# Patient Record
Sex: Male | Born: 1935 | Race: White | Hispanic: No | Marital: Married | Smoking: Former smoker
Health system: Southern US, Community
[De-identification: ages and names within clinical notes are randomized; demographics above are authoritative.]

## PROBLEM LIST (undated history)

## (undated) DIAGNOSIS — M1990 Unspecified osteoarthritis, unspecified site: Secondary | ICD-10-CM

## (undated) DIAGNOSIS — D649 Anemia, unspecified: Secondary | ICD-10-CM

## (undated) DIAGNOSIS — Z87891 Personal history of nicotine dependence: Secondary | ICD-10-CM

## (undated) HISTORY — DX: Anemia, unspecified: D64.9

## (undated) HISTORY — DX: Unspecified osteoarthritis, unspecified site: M19.90

---

## 1977-12-31 DIAGNOSIS — Z87891 Personal history of nicotine dependence: Secondary | ICD-10-CM

## 1977-12-31 HISTORY — DX: Personal history of nicotine dependence: Z87.891

## 2009-04-30 DEATH — deceased

## 2016-07-23 ENCOUNTER — Inpatient Hospital Stay (HOSPITAL_COMMUNITY)
Admission: EM | Admit: 2016-07-23 | Discharge: 2016-07-25 | DRG: 872 | Disposition: A | Discharge status: Home / Self Care | Payer: Medicare Other | Attending: Internal Medicine | Admitting: Internal Medicine

## 2016-07-23 ENCOUNTER — Inpatient Hospital Stay (HOSPITAL_COMMUNITY): Payer: Medicare Other

## 2016-07-23 ENCOUNTER — Emergency Department (HOSPITAL_COMMUNITY): Payer: Medicare Other

## 2016-07-23 ENCOUNTER — Encounter (HOSPITAL_COMMUNITY): Payer: Self-pay | Admitting: *Deleted

## 2016-07-23 DIAGNOSIS — Z955 Presence of coronary angioplasty implant and graft: Secondary | ICD-10-CM | POA: Diagnosis not present

## 2016-07-23 DIAGNOSIS — Z87891 Personal history of nicotine dependence: Secondary | ICD-10-CM

## 2016-07-23 DIAGNOSIS — N183 Chronic kidney disease, stage 3 (moderate): Secondary | ICD-10-CM | POA: Diagnosis present

## 2016-07-23 DIAGNOSIS — N179 Acute kidney failure, unspecified: Secondary | ICD-10-CM | POA: Diagnosis present

## 2016-07-23 DIAGNOSIS — N189 Chronic kidney disease, unspecified: Secondary | ICD-10-CM

## 2016-07-23 DIAGNOSIS — K224 Dyskinesia of esophagus: Secondary | ICD-10-CM | POA: Diagnosis present

## 2016-07-23 DIAGNOSIS — C8590 Non-Hodgkin lymphoma, unspecified, unspecified site: Secondary | ICD-10-CM | POA: Diagnosis present

## 2016-07-23 DIAGNOSIS — R600 Localized edema: Secondary | ICD-10-CM | POA: Diagnosis not present

## 2016-07-23 DIAGNOSIS — Z951 Presence of aortocoronary bypass graft: Secondary | ICD-10-CM

## 2016-07-23 DIAGNOSIS — M79662 Pain in left lower leg: Secondary | ICD-10-CM | POA: Diagnosis present

## 2016-07-23 DIAGNOSIS — R911 Solitary pulmonary nodule: Secondary | ICD-10-CM | POA: Diagnosis present

## 2016-07-23 DIAGNOSIS — J181 Lobar pneumonia, unspecified organism: Secondary | ICD-10-CM

## 2016-07-23 DIAGNOSIS — E860 Dehydration: Secondary | ICD-10-CM | POA: Diagnosis present

## 2016-07-23 DIAGNOSIS — C61 Malignant neoplasm of prostate: Secondary | ICD-10-CM | POA: Diagnosis present

## 2016-07-23 DIAGNOSIS — I2510 Atherosclerotic heart disease of native coronary artery without angina pectoris: Secondary | ICD-10-CM | POA: Diagnosis present

## 2016-07-23 DIAGNOSIS — M069 Rheumatoid arthritis, unspecified: Secondary | ICD-10-CM | POA: Diagnosis present

## 2016-07-23 DIAGNOSIS — I350 Nonrheumatic aortic (valve) stenosis: Secondary | ICD-10-CM | POA: Diagnosis present

## 2016-07-23 DIAGNOSIS — M7989 Other specified soft tissue disorders: Secondary | ICD-10-CM | POA: Diagnosis present

## 2016-07-23 DIAGNOSIS — A419 Sepsis, unspecified organism: Secondary | ICD-10-CM | POA: Diagnosis not present

## 2016-07-23 DIAGNOSIS — R222 Localized swelling, mass and lump, trunk: Secondary | ICD-10-CM

## 2016-07-23 DIAGNOSIS — L03116 Cellulitis of left lower limb: Secondary | ICD-10-CM | POA: Diagnosis present

## 2016-07-23 DIAGNOSIS — D638 Anemia in other chronic diseases classified elsewhere: Secondary | ICD-10-CM | POA: Diagnosis present

## 2016-07-23 DIAGNOSIS — B9689 Other specified bacterial agents as the cause of diseases classified elsewhere: Secondary | ICD-10-CM | POA: Diagnosis not present

## 2016-07-23 DIAGNOSIS — I739 Peripheral vascular disease, unspecified: Secondary | ICD-10-CM | POA: Diagnosis present

## 2016-07-23 HISTORY — DX: Personal history of nicotine dependence: Z87.891

## 2016-07-23 LAB — URINE MICROSCOPIC-ADD ON

## 2016-07-23 LAB — CBC WITH DIFFERENTIAL/PLATELET
BASOS ABS: 0 10*3/uL (ref 0.0–0.1)
Basophils Relative: 0 %
EOS PCT: 0 %
Eosinophils Absolute: 0 10*3/uL (ref 0.0–0.7)
HEMATOCRIT: 32.7 % — AB (ref 39.0–52.0)
Hemoglobin: 10.1 g/dL — ABNORMAL LOW (ref 13.0–17.0)
LYMPHS ABS: 0.5 10*3/uL — AB (ref 0.7–4.0)
Lymphocytes Relative: 2 %
MCH: 28.1 pg (ref 26.0–34.0)
MCHC: 30.9 g/dL (ref 30.0–36.0)
MCV: 91.1 fL (ref 78.0–100.0)
MONO ABS: 0.6 10*3/uL (ref 0.1–1.0)
Monocytes Relative: 3 %
NEUTROS ABS: 21.8 10*3/uL — AB (ref 1.7–7.7)
Neutrophils Relative %: 95 %
Platelets: 212 10*3/uL (ref 150–400)
RBC: 3.59 MIL/uL — AB (ref 4.22–5.81)
RDW: 16.2 % — ABNORMAL HIGH (ref 11.5–15.5)
WBC: 22.9 10*3/uL — AB (ref 4.0–10.5)

## 2016-07-23 LAB — I-STAT CG4 LACTIC ACID, ED
LACTIC ACID, VENOUS: 1.11 mmol/L (ref 0.5–1.9)
Lactic Acid, Venous: 2.17 mmol/L (ref 0.5–1.9)
Lactic Acid, Venous: 1.71 mmol/L (ref 0.5–1.9)

## 2016-07-23 LAB — COMPREHENSIVE METABOLIC PANEL
ALBUMIN: 2.8 g/dL — AB (ref 3.5–5.0)
ALT: 17 U/L (ref 17–63)
AST: 41 U/L (ref 15–41)
Alkaline Phosphatase: 43 U/L (ref 38–126)
Anion gap: 8 (ref 5–15)
BILIRUBIN TOTAL: 0.9 mg/dL (ref 0.3–1.2)
BUN: 38 mg/dL — AB (ref 6–20)
CO2: 25 mmol/L (ref 22–32)
CREATININE: 2.26 mg/dL — AB (ref 0.61–1.24)
Calcium: 8.4 mg/dL — ABNORMAL LOW (ref 8.9–10.3)
Chloride: 106 mmol/L (ref 101–111)
GFR calc Af Amer: 30 mL/min — ABNORMAL LOW (ref >60)
GFR, EST NON AFRICAN AMERICAN: 26 mL/min — AB (ref >60)
GLUCOSE: 115 mg/dL — AB (ref 65–99)
POTASSIUM: 4 mmol/L (ref 3.5–5.1)
Sodium: 139 mmol/L (ref 135–145)
TOTAL PROTEIN: 6.5 g/dL (ref 6.5–8.1)

## 2016-07-23 LAB — URINALYSIS, ROUTINE W REFLEX MICROSCOPIC
GLUCOSE, UA: NEGATIVE mg/dL
HGB URINE DIPSTICK: NEGATIVE
Ketones, ur: NEGATIVE mg/dL
Nitrite: NEGATIVE
PH: 5 (ref 5.0–8.0)
Protein, ur: NEGATIVE mg/dL
Specific Gravity, Urine: 1.021 (ref 1.005–1.030)

## 2016-07-23 LAB — I-STAT BETA HCG BLOOD, ED (MC, WL, AP ONLY): HCG, QUANTITATIVE: 5.5 m[IU]/mL — AB (ref <5)

## 2016-07-23 MED ORDER — PIPERACILLIN-TAZOBACTAM 3.375 G IVPB
3.3750 g | Freq: Three times a day (TID) | INTRAVENOUS | Status: DC
  Administered 2016-07-23 – 2016-07-24 (×2): 3.375 g via INTRAVENOUS
  Filled 2016-07-23 (×4): qty 50

## 2016-07-23 MED ORDER — HYDROXYCHLOROQUINE SULFATE 200 MG PO TABS
200.0000 mg | ORAL_TABLET | Freq: Every day | ORAL | Status: DC
  Administered 2016-07-24 – 2016-07-25 (×2): 200 mg via ORAL
  Filled 2016-07-23 (×2): qty 1

## 2016-07-23 MED ORDER — METOPROLOL SUCCINATE ER 50 MG PO TB24
50.0000 mg | ORAL_TABLET | Freq: Every day | ORAL | Status: DC
Start: 2016-07-24 — End: 2016-07-23

## 2016-07-23 MED ORDER — ASPIRIN EC 81 MG PO TBEC
81.0000 mg | DELAYED_RELEASE_TABLET | Freq: Every day | ORAL | Status: DC
  Administered 2016-07-24 – 2016-07-25 (×2): 81 mg via ORAL
  Filled 2016-07-23 (×2): qty 1

## 2016-07-23 MED ORDER — SODIUM CHLORIDE 0.9 % IV SOLN
INTRAVENOUS | Status: DC
  Administered 2016-07-23: 17:00:00 via INTRAVENOUS

## 2016-07-23 MED ORDER — ACETAMINOPHEN 650 MG RE SUPP: 650.0000 mg | Freq: Four times a day (QID) | RECTAL | Status: DC | PRN

## 2016-07-23 MED ORDER — GABAPENTIN 100 MG PO CAPS
100.0000 mg | ORAL_CAPSULE | Freq: Every day | ORAL | Status: DC
  Administered 2016-07-23 – 2016-07-24 (×2): 100 mg via ORAL
  Filled 2016-07-23 (×2): qty 1

## 2016-07-23 MED ORDER — PREDNISONE 5 MG PO TABS
6.0000 mg | ORAL_TABLET | Freq: Every day | ORAL | Status: DC
  Administered 2016-07-24 – 2016-07-25 (×2): 6 mg via ORAL
  Filled 2016-07-23 (×3): qty 1

## 2016-07-23 MED ORDER — FERROUS SULFATE DRIED ER 45 MG PO TBCR: 90.0000 mg | EXTENDED_RELEASE_TABLET | Freq: Every day | ORAL | Status: DC

## 2016-07-23 MED ORDER — TRAMADOL HCL 50 MG PO TABS: 50.0000 mg | ORAL_TABLET | Freq: Two times a day (BID) | ORAL | Status: DC | PRN

## 2016-07-23 MED ORDER — VANCOMYCIN HCL 500 MG IV SOLR
500.0000 mg | INTRAVENOUS | Status: DC
  Administered 2016-07-24: 500 mg via INTRAVENOUS
  Filled 2016-07-23 (×2): qty 500

## 2016-07-23 MED ORDER — VANCOMYCIN HCL IN DEXTROSE 1-5 GM/200ML-% IV SOLN
1000.0000 mg | Freq: Once | INTRAVENOUS | Status: AC
  Administered 2016-07-23: 1000 mg via INTRAVENOUS
  Filled 2016-07-23: qty 200

## 2016-07-23 MED ORDER — SODIUM CHLORIDE 0.9 % IV BOLUS (SEPSIS)
1000.0000 mL | Freq: Once | INTRAVENOUS | Status: AC
  Administered 2016-07-23: 1000 mL via INTRAVENOUS

## 2016-07-23 MED ORDER — SODIUM CHLORIDE 0.9 % IV BOLUS (SEPSIS)
1000.0000 mL | Freq: Once | INTRAVENOUS | Status: AC
Start: 2016-07-23 — End: 2016-07-23
  Administered 2016-07-23: 1000 mL via INTRAVENOUS

## 2016-07-23 MED ORDER — FERROUS SULFATE 325 (65 FE) MG PO TABS
325.0000 mg | ORAL_TABLET | Freq: Every day | ORAL | Status: DC
  Administered 2016-07-24 – 2016-07-25 (×2): 325 mg via ORAL
  Filled 2016-07-23 (×2): qty 1

## 2016-07-23 MED ORDER — PANTOPRAZOLE SODIUM 40 MG PO TBEC
40.0000 mg | DELAYED_RELEASE_TABLET | Freq: Every day | ORAL | Status: DC
  Administered 2016-07-24 – 2016-07-25 (×2): 40 mg via ORAL
  Filled 2016-07-23 (×2): qty 1

## 2016-07-23 MED ORDER — SODIUM CHLORIDE 0.9% FLUSH
3.0000 mL | Freq: Two times a day (BID) | INTRAVENOUS | Status: DC
  Administered 2016-07-23 – 2016-07-24 (×2): 3 mL via INTRAVENOUS

## 2016-07-23 MED ORDER — PIPERACILLIN-TAZOBACTAM 3.375 G IVPB 30 MIN
3.3750 g | Freq: Once | INTRAVENOUS | Status: AC
  Administered 2016-07-23: 3.375 g via INTRAVENOUS
  Filled 2016-07-23: qty 50

## 2016-07-23 MED ORDER — SODIUM CHLORIDE 0.9 % IV SOLN
INTRAVENOUS | Status: AC
  Administered 2016-07-23: 19:00:00 via INTRAVENOUS

## 2016-07-23 MED ORDER — SENNOSIDES-DOCUSATE SODIUM 8.6-50 MG PO TABS: 1.0000 | ORAL_TABLET | Freq: Every evening | ORAL | Status: DC | PRN

## 2016-07-23 MED ORDER — ENOXAPARIN SODIUM 30 MG/0.3ML ~~LOC~~ SOLN
30.0000 mg | SUBCUTANEOUS | Status: DC
  Administered 2016-07-23 – 2016-07-24 (×2): 30 mg via SUBCUTANEOUS
  Filled 2016-07-23: qty 0.3

## 2016-07-23 MED ORDER — ACETAMINOPHEN 325 MG PO TABS
650.0000 mg | ORAL_TABLET | Freq: Four times a day (QID) | ORAL | Status: DC | PRN
  Administered 2016-07-23 – 2016-07-24 (×2): 650 mg via ORAL
  Filled 2016-07-23 (×2): qty 2

## 2016-07-23 NOTE — H&P (Signed)
Date: 07/23/2016               Patient Name:  Caleb Christian MRN: [id number]  DOB: Mar 07, 1934 Age / Sex: 82 y.o., male   PCP: No primary care provider on file.         Medical Service: Internal Medicine Teaching Service         Attending Physician: Dr. Aldine Contes, MD    First Contact: Dr. Asencion Partridge Pager: [phone]  Second Contact: Dr. Jacques Earthly Pager: [phone]       After Hours (After 5p/  First Contact Pager: [phone]  weekends / holidays): Second Contact Pager: [phone]   Chief Complaint: Left leg swelling and erythema  History of Present Illness: Caleb Christian is a 82 year old gentleman with PMH rheumatoid arthritis, CKD3, anemia of chronic disease, CAD s/p 2 stents and CABG, MI, aortic stenosis, prostate cancer, lymphoma, and PAD s/p L fem-pop bypass 01/2016 who presents with 2 days of worsening left leg swelling and erythema, along with episodes of confusion and nausea/vomiting. Caleb Christian lives in [place], [place] and gets all his medical care up there and is here in [place] visiting his daughter with his wife. This episode is very similar to a hospitalization in May 2017 for left leg cellulitis that required 9 days of IV antibiotics. At that time he was discharged on 7 days of Amoxacillin and the swelling and pain in his left leg improved, but the erythema (from foot to mid-calf) has persisted and this was felt to be benign by his care team.  Per the patient in family at bedside, two days ago he noticed that his left leg was more swollen than usual, and that the erythema was beginning to spread up his leg. This gradually worsened and now he his L calf and foot are brightly erythematous and moderately swollen. The erythema tracks up the medial and lateral sides of his thigh as well. Additionally he developed fevers and chills last night while watching TV with his family, and after dinner he vomited and was talking strangely and seemed confused with his family. He woke up in the  middle of the night with no memory of this episode. He has been today but the nausea/vomiting and chills persisted. He denies pain in the affected extremity, chest pain, shortness of breath, appetite loss, or abdominal pain.  In the ED was hypotensive to 97/56, HR 56, RR 24, 96% on RA. Labs remarkable for WBC 22.9, Cr 2.26, lactic acid 2.17. Blood cx drawn, given 2x 1L NS bolus, started on Vanc/Zosyn.  Meds: Current Facility-Administered Medications  Medication Dose Route Frequency Provider Last Rate Last Dose  . 0.9 %  sodium chloride infusion   Intravenous Continuous Milagros Loll, MD 100 mL/hr at 07/23/16 1845    . acetaminophen (TYLENOL) tablet 650 mg  650 mg Oral Q6H PRN Milagros Loll, MD       Or  . acetaminophen (TYLENOL) suppository 650 mg  650 mg Rectal Q6H PRN Milagros Loll, MD      . Derrill Memo ON 07/24/2016] aspirin EC tablet 81 mg  81 mg Oral Daily Milagros Loll, MD      . enoxaparin (LOVENOX) injection 30 mg  30 mg Subcutaneous Q24H Milagros Loll, MD      . Derrill Memo ON 07/24/2016] Ferrous Sulfate Dried TBCR 90 mg  90 mg Oral Daily Milagros Loll, MD      . gabapentin (NEURONTIN) capsule 100  mg  100 mg Oral QHS Milagros Loll, MD      . Derrill Memo ON 07/24/2016] hydroxychloroquine (PLAQUENIL) tablet 200 mg  200 mg Oral Daily Milagros Loll, MD      . Derrill Memo ON 07/24/2016] pantoprazole (PROTONIX) EC tablet 40 mg  40 mg Oral Daily Milagros Loll, MD      . piperacillin-tazobactam (ZOSYN) IVPB 3.375 g  3.375 g Intravenous Q8H Rachel L Rumbarger, RPH      . [START ON 07/24/2016] predniSONE (DELTASONE) tablet 6 mg  6 mg Oral Q breakfast Milagros Loll, MD      . senna-docusate (Senokot-S) tablet 1 tablet  1 tablet Oral QHS PRN Milagros Loll, MD      . sodium chloride flush (NS) 0.9 % injection 3 mL  3 mL Intravenous Q12H Milagros Loll, MD      . traMADol Veatrice Bourbon) tablet 50 mg  50 mg Oral Q12H PRN Milagros Loll, MD      . Derrill Memo ON 07/24/2016] vancomycin (VANCOCIN) 500  mg in sodium chloride 0.9 % 100 mL IVPB  500 mg Intravenous Q24H Valeda Malm Rumbarger, RPH        Allergies: Allergies as of 07/23/2016 - Review Complete 07/23/2016  Allergen Reaction Noted  . Adhesive [tape] Other (See Comments) 07/23/2016  . Codeine Hives 07/23/2016   No past medical history on file.  Family History: Family history significant for rheumatoid arthritis, heart disease, Alzheimer's disease  Social History:  Married, lives at home with wife in [place], former smoker 1ppd x32 yrs quit 1979, no alcohol, no recreational drugs, ambulates independently and independent in ADLs and IADLs at home  Code status - desires full code after discussion  Review of Systems: A complete ROS was negative except as per HPI.   Physical Exam: Blood pressure (!) 112/55, pulse (!) 106, temperature 98 F (36.7 C), temperature source Oral, resp. rate 16, height 5\' 7"  (1.702 m), weight 62.1 kg (137 lb), SpO2 90 %.   General appearance: Thin elderly gentleman resting comfortably in stretcher, conversational and appears stated age HENT: Normocephalic, atraumatic, neck supple Eyes: PERRL, non-icteric Cardiovascular: Systolic crescendo-decrescendo murmur over LSB, regular rate and rhythm Respiratory: Clear to auscultation bilaterally, normal work of breathing Abdomen: BS+, soft, non-tender, non-distended Extremities/Back: Soft mobile mass at posterior border of left axilla, 1+ pulses in LLE, otherwise 2+ throughout Skin: Warm, dry, diffuse chronic bruising over bilateral forearms, erythema and swelling of the LLE, tracks up medial and lateral thigh Neuro: Cranial nerves grossly intact, alert and oriented Psych: Appropriate affect  CBC Latest Ref Rng & Units 07/23/2016  WBC 4.0 - 10.5 K/uL 22.9(H)  Hemoglobin 13.0 - 17.0 g/dL 10.1(L)  Hematocrit 39.0 - 52.0 % 32.7(L)  Platelets 150 - 400 K/uL 212   BMP Latest Ref Rng & Units 07/23/2016  Glucose 65 - 99 mg/dL 115(H)  BUN 6 - 20 mg/dL 38(H)    Creatinine 0.61 - 1.24 mg/dL 2.26(H)  Sodium 135 - 145 mmol/L 139  Potassium 3.5 - 5.1 mmol/L 4.0  Chloride 101 - 111 mmol/L 106  CO2 22 - 32 mmol/L 25  Calcium 8.9 - 10.3 mg/dL 8.4(L)   EKG: Rate 60, QT 461, SR RBBB, LVH, LAFB  CXR: IMPRESSION: Focal masslike area of consolidation within the right lower lung. This is nonspecific in etiology. While this may potentially be infectious in etiology, malignancy or other processes are not excluded. Recommend dedicated evaluation with chest CT. Aortic vascular calcifications.  Assessment & Plan  by Problem: Principal Problem:   Pain and swelling of left lower leg Active Problems:   Sepsis ([place])   Lung nodule   Acute-on-chronic kidney injury [facility]) Caleb Christian is a 82 year old gentleman with extensive PMH detailed above here for sepsis likely secondary for recurrence of LLE cellulitis.  Sepsis, presented hypotensive and tachypneic, hemodynamics improved s/p 2L IVF, likely dehydration, due to LLE cellulitis vs thrombophlebitis, very similar past hospitalization for LLE cellulitis in May   - Continue empiric Vanc/Zosyn  - mIVF, bolus as needed  - LLE venous duplex ultrasound  - Follow blood cultures  - Hold home metoprolol given relative ongoing hypotension  Acute on chronic kidney disease, likely pre-renal due to dehydration and N/V, CrCl is 23 and use of fenofebrate is contraindicated  - mIVF  - Hold fenofibrate  - Avoid NSAIDs, contrast, other nephrotoxic agents, renally-dose when possible  Lung nodule, not present on previous outside imaging  - CT chest without contrast  CAD Anemia of chronic disease Rheumatoid arthritis  Continue home medications   Dispo: Admit patient to Inpatient with expected length of stay greater than 2 midnights.  Signed: Asencion Partridge, MD 07/23/2016, 7:49 PM  Pager: [phone]

## 2016-07-23 NOTE — ED Notes (Signed)
Doctor notified of patient's test result lactic acid 2.17 and WBC 22.9.

## 2016-07-23 NOTE — ED Provider Notes (Signed)
[facility] DEPT Provider Note   CSN: [id number] Arrival date & time: 07/23/16  1138  First Provider Contact:  First MD Initiated Contact with Patient 07/23/16 1425        History   Chief Complaint Chief Complaint  Patient presents with  . Cellulitis  . Altered Mental Status    HPI Caleb Christian is a 82 y.o. male.  Patient is a 82 year old male with a history of aortic stenosis, peripheral vascular disease status post leg bypass that was done in January presenting today with fever, left lower extremity swelling, redness and confusion. Symptoms started 2 days ago and have worsened. He denies any pain in the leg but has had multiple episodes of vomiting today. Patient is visiting family resides in [place] where he has had all of his medical care done. Similar symptoms occurred in May where he required a 9 day hospital stay for cellulitis. He has not been on antibiotics since then. He currently denies any chest pain, shortness of breath, nausea or abdominal pain. He has had no issues with urination.   The history is provided by the patient and the spouse.    No past medical history on file.  There are no active problems to display for this patient.   No past surgical history on file.     Home Medications    Prior to Admission medications   Medication Sig Start Date End Date Taking? Authorizing Provider  acetaminophen (TYLENOL) 650 MG CR tablet Take 1,300 mg by mouth 2 (two) times daily.   Yes Historical Provider, MD  aspirin EC 81 MG tablet Take 81 mg by mouth daily.   Yes Historical Provider, MD  fenofibrate (TRICOR) 145 MG tablet Take 145 mg by mouth daily.   Yes Historical Provider, MD  Ferrous Sulfate Dried (SLOW RELEASE IRON) 45 MG TBCR Take 90 mg by mouth daily.   Yes Historical Provider, MD  gabapentin (NEURONTIN) 100 MG capsule Take 100 mg by mouth at bedtime.  07/14/16  Yes Historical Provider, MD  hydroxychloroquine (PLAQUENIL) 200 MG tablet Take 200  mg by mouth daily. 06/15/16  Yes Historical Provider, MD  metoprolol succinate (TOPROL-XL) 50 MG 24 hr tablet Take 50 mg by mouth daily. 07/17/16  Yes Historical Provider, MD  omeprazole (PRILOSEC) 20 MG capsule Take 20 mg by mouth daily. 07/12/16  Yes Historical Provider, MD  predniSONE (DELTASONE) 1 MG tablet Take 6 mg by mouth daily with breakfast.   Yes Historical Provider, MD  traMADol (ULTRAM) 50 MG tablet Take 50 mg by mouth daily.   Yes Historical Provider, MD    Family History No family history on file.  Social History Social History  Substance Use Topics  . Smoking status: Not on file  . Smokeless tobacco: Not on file  . Alcohol use Not on file     Allergies   Adhesive [tape] and Codeine   Review of Systems Review of Systems  All other systems reviewed and are negative.    Physical Exam Updated Vital Signs BP 110/64 (BP Location: Right Arm)   Pulse (!) 52   Temp 97.9 F (36.6 C) (Oral)   Resp 12   Ht 5\' 7"  (1.702 m)   Wt 132 lb (59.9 kg)   SpO2 100%   BMI 20.67 kg/m   Physical Exam  Constitutional: He is oriented to person, place, and time. He appears well-developed and well-nourished. No distress.  HENT:  Head: Normocephalic and atraumatic.  Mouth/Throat: Oropharynx is clear  and moist.  Eyes: Conjunctivae and EOM are normal. Pupils are equal, round, and reactive to light.  Neck: Normal range of motion. Neck supple.  Cardiovascular: Normal rate, regular rhythm and intact distal pulses.   Murmur heard. Pulmonary/Chest: Effort normal and breath sounds normal. No respiratory distress. He has no wheezes. He has no rales.  Abdominal: Soft. He exhibits no distension. There is no tenderness. There is no rebound and no guarding.  Musculoskeletal: Normal range of motion. He exhibits no edema or tenderness.  Neurological: He is alert and oriented to person, place, and time.  Skin: Skin is warm and dry. Capillary refill takes 2 to 3 seconds. No rash noted. There is  erythema.     Psychiatric: He has a normal mood and affect. His behavior is normal.  Nursing note and vitals reviewed.    ED Treatments / Results  Labs (all labs ordered are listed, but only abnormal results are displayed) Labs Reviewed  COMPREHENSIVE METABOLIC PANEL - Abnormal; Notable for the following:       Result Value   Glucose, Bld 115 (*)    BUN 38 (*)    Creatinine, Ser 2.26 (*)    Calcium 8.4 (*)    Albumin 2.8 (*)    GFR calc non Af Amer 26 (*)    GFR calc Af Amer 30 (*)    All other components within normal limits  CBC WITH DIFFERENTIAL/PLATELET - Abnormal; Notable for the following:    WBC 22.9 (*)    RBC 3.59 (*)    Hemoglobin 10.1 (*)    HCT 32.7 (*)    RDW 16.2 (*)    Neutro Abs 21.8 (*)    Lymphs Abs 0.5 (*)    All other components within normal limits  I-STAT BETA HCG BLOOD, ED ([facility], [facility], [facility] ONLY) - Abnormal; Notable for the following:    I-stat hCG, quantitative 5.5 (*)    All other components within normal limits  I-STAT CG4 LACTIC ACID, ED - Abnormal; Notable for the following:    Lactic Acid, Venous 2.17 (*)    All other components within normal limits  CULTURE, BLOOD (ROUTINE X 2)  CULTURE, BLOOD (ROUTINE X 2)  URINE CULTURE  URINALYSIS, ROUTINE W REFLEX MICROSCOPIC (NOT AT [facility])  I-STAT CG4 LACTIC ACID, ED  I-STAT CG4 LACTIC ACID, ED    EKG  EKG Interpretation  Date/Time:  Monday July 23 2016 13:37:13 EDT Ventricular Rate:  60 PR Interval:    QRS Duration: 136 QT Interval:  461 QTC Calculation: 461 R Axis:   -44 Text Interpretation:  Sinus rhythm RBBB and LAFB Left ventricular hypertrophy No previous tracing Confirmed by Maryan Rued  MD, Loree Fee ([id number]) on 07/23/2016 2:10:31 PM       Radiology Dg Chest 2 View  Result Date: 07/23/2016 CLINICAL DATA:  Patient with lower the redness and swelling. Prior CABG procedure. EXAM: CHEST  2 VIEW COMPARISON:  None. FINDINGS: Normal cardiac and mediastinal contours. Focal masslike area of  consolidation within the right lower lung. No consolidative pulmonary opacities. No pleural effusion or pneumothorax. Left shoulder arthroplasty. Thoracic spine degenerative changes. Aortic vascular calcifications. IMPRESSION: Focal masslike area of consolidation within the right lower lung. This is nonspecific in etiology. While this may potentially be infectious in etiology, malignancy or other processes are not excluded. Recommend dedicated evaluation with chest CT. Aortic vascular calcifications. Electronically Signed   By: Lovey Newcomer M.D.   On: 07/23/2016 13:21   Procedures Procedures (including critical care  time)  Medications Ordered in ED Medications  vancomycin (VANCOCIN) 500 mg in sodium chloride 0.9 % 100 mL IVPB (not administered)  piperacillin-tazobactam (ZOSYN) IVPB 3.375 g (not administered)  sodium chloride 0.9 % bolus 1,000 mL (1,000 mLs Intravenous New Bag/Given 07/23/16 1340)    And  sodium chloride 0.9 % bolus 1,000 mL (1,000 mLs Intravenous New Bag/Given 07/23/16 1359)  piperacillin-tazobactam (ZOSYN) IVPB 3.375 g (0 g Intravenous Stopped 07/23/16 1430)  vancomycin (VANCOCIN) IVPB 1000 mg/200 mL premix (0 mg Intravenous Stopped 07/23/16 1500)     Initial Impression / Assessment and Plan / ED Course  I have reviewed the triage vital signs and the nursing notes.  Pertinent labs & imaging results that were available during my care of the patient were reviewed by me and considered in my medical decision making (see chart for details).  Clinical Course   Patient is a 82 year old male with multiple medical problems including recent bypass graft done in the left lower extremity in January presenting today with fever, confusion, vomiting and worsening left leg redness and warmth. Patient is visiting from [place] and states symptoms started a few days ago. This happened approximately 2 months ago and he required a 9 day stay in the hospital with IV antibiotics. He denies any  shortness of breath, chest pain or abdominal pain.  Patient meets sepsis criteria and labs are sent in the waiting room. His initial lactic acid was 2.17. Patient's creatinine is 2.5 with no known history of chronic renal insufficiency however all patients medical care is in [place] and he does not know his baseline creatinine.  Patient given 2 L of IV fluid based on sepsis criteria. He initially was mildly hypotensive which improved with fluids. Patient was started on Vanco and Zosyn for cellulitis protocol. White count is elevated at 22,000. Patient currently is not altered. He denies any cough but chest x-ray shows concern for mass that will need a CT. Patient will be admitted that CT was ordered.  Final Clinical Impressions(s) / ED Diagnoses   Final diagnoses:  None    New Prescriptions New Prescriptions   No medications on file     Blanchie Dessert, MD 07/23/16 1524

## 2016-07-23 NOTE — ED Notes (Signed)
Called mini lab states lactic acid currently running.

## 2016-07-23 NOTE — Progress Notes (Addendum)
Pharmacy Antibiotic Note  Caleb Christian is a 82 y.o. male admitted on 07/23/2016 with cellulitis.  Pharmacy has been consulted for vancomycin dosing. Pt is afebrile and WBC is elevated. SCr is elevated and so is lactic acid at 2.17.   Plan: - Vancomycin 1gm IV x 1 then 500mg  IV Q24H - Zosyn 3.375gm IV Q8H (4 hr inf) - F/u renal fxn, C&S, clinical status and trough at SS  Height: 5\' 7"  (170.2 cm) Weight: 132 lb (59.9 kg) IBW/kg (Calculated) : 66.1  Temp (24hrs), Avg:99.1 F (37.3 C), Min:98.8 F (37.1 C), Max:99.3 F (37.4 C)   Recent Labs Lab 07/23/16 1240 07/23/16 1313  WBC 22.9*  --   CREATININE 2.26*  --   LATICACIDVEN  --  2.17*    Estimated Creatinine Clearance: 22.5 mL/min (by C-G formula based on SCr of 2.26 mg/dL).    Allergies not on file  Antimicrobials this admission: Vanc 7/24>> Zosyn 7/24>>  Dose adjustments this admission: N/A  Microbiology results: Pending  Thank you for allowing pharmacy to be a part of this patient's care.  Shacoya Burkhammer, Rande Lawman 07/23/2016 1:45 PM

## 2016-07-23 NOTE — ED Triage Notes (Signed)
Pt here visiting from [place], [place]- left leg red with streak going up to thigh-- has had fem-pop bypass surgery on leg Jan 18th, 2017. Has had cellulitis in leg in the past. Leg is warm, red.  Pt had an episode of vomiting last night, then became confused -- does not remember what happened after vomiting. Also vomited this am.

## 2016-07-23 NOTE — Progress Notes (Signed)
Given the report given to me at report: patient choking on food and drink, and she is uncomfortable feeding him or giving him liquids, I paged On Call and informed her of this. Perhaps another SLP eval should be done.

## 2016-07-23 NOTE — ED Notes (Signed)
Pedal pulse audible with doppler-- marked with an X.

## 2016-07-23 NOTE — ED Notes (Signed)
Care transferred to [facility] [place], [place]

## 2016-07-23 NOTE — ED Notes (Signed)
Admitting at bedside 

## 2016-07-23 NOTE — Progress Notes (Signed)
Caleb Christian is a 82 y.o. male patient admitted from ED awake, alert - oriented  X 4 - no acute distress noted.  VSS - Blood pressure (!) 112/55, pulse (!) 106, temperature 98 F (36.7 C), temperature source Oral, resp. rate 16, height 5\' 7"  (1.702 m), weight 62.1 kg (137 lb), SpO2 90 %.    IV in place, occlusive dsg intact without redness.  Orientation to room, and floor completed with information packet given to patient/family.  Patient declined safety video at this time.  Admission INP armband ID verified with patient/family, and in place.   SR up x 2, fall assessment complete, with patient and family able to verbalize understanding of risk associated with falls, and verbalized understanding to call nsg before up out of bed.  Call light within reach, patient able to voice, and demonstrate understanding.  Skin, clean-dry- intact without evidence of bruising, or skin tears.   No evidence of skin break down noted on exam.     Will cont to eval and treat per MD orders.  Lynann Beaver, RN 07/23/2016 7:00 PM

## 2016-07-24 ENCOUNTER — Inpatient Hospital Stay (HOSPITAL_COMMUNITY): Payer: Medicare Other

## 2016-07-24 DIAGNOSIS — N183 Chronic kidney disease, stage 3 (moderate): Secondary | ICD-10-CM

## 2016-07-24 DIAGNOSIS — Z8719 Personal history of other diseases of the digestive system: Secondary | ICD-10-CM

## 2016-07-24 DIAGNOSIS — B9689 Other specified bacterial agents as the cause of diseases classified elsewhere: Secondary | ICD-10-CM

## 2016-07-24 DIAGNOSIS — M069 Rheumatoid arthritis, unspecified: Secondary | ICD-10-CM

## 2016-07-24 DIAGNOSIS — I2510 Atherosclerotic heart disease of native coronary artery without angina pectoris: Secondary | ICD-10-CM

## 2016-07-24 DIAGNOSIS — R600 Localized edema: Secondary | ICD-10-CM

## 2016-07-24 DIAGNOSIS — Z955 Presence of coronary angioplasty implant and graft: Secondary | ICD-10-CM

## 2016-07-24 DIAGNOSIS — R911 Solitary pulmonary nodule: Secondary | ICD-10-CM

## 2016-07-24 DIAGNOSIS — L03116 Cellulitis of left lower limb: Secondary | ICD-10-CM

## 2016-07-24 DIAGNOSIS — N179 Acute kidney failure, unspecified: Secondary | ICD-10-CM

## 2016-07-24 DIAGNOSIS — D631 Anemia in chronic kidney disease: Secondary | ICD-10-CM

## 2016-07-24 LAB — BASIC METABOLIC PANEL
ANION GAP: 7 (ref 5–15)
BUN: 33 mg/dL — AB (ref 6–20)
CALCIUM: 7.7 mg/dL — AB (ref 8.9–10.3)
CO2: 22 mmol/L (ref 22–32)
Chloride: 110 mmol/L (ref 101–111)
Creatinine, Ser: 1.78 mg/dL — ABNORMAL HIGH (ref 0.61–1.24)
GFR calc Af Amer: 40 mL/min — ABNORMAL LOW (ref >60)
GFR, EST NON AFRICAN AMERICAN: 35 mL/min — AB (ref >60)
Glucose, Bld: 77 mg/dL (ref 65–99)
POTASSIUM: 3.7 mmol/L (ref 3.5–5.1)
SODIUM: 139 mmol/L (ref 135–145)

## 2016-07-24 LAB — URINE CULTURE

## 2016-07-24 LAB — CBC
HCT: 27.1 % — ABNORMAL LOW (ref 39.0–52.0)
Hemoglobin: 8.3 g/dL — ABNORMAL LOW (ref 13.0–17.0)
MCH: 28.2 pg (ref 26.0–34.0)
MCHC: 30.6 g/dL (ref 30.0–36.0)
MCV: 92.2 fL (ref 78.0–100.0)
PLATELETS: 165 10*3/uL (ref 150–400)
RBC: 2.94 MIL/uL — AB (ref 4.22–5.81)
RDW: 16.5 % — AB (ref 11.5–15.5)
WBC: 15.3 10*3/uL — AB (ref 4.0–10.5)

## 2016-07-24 MED ORDER — VANCOMYCIN HCL 1000 MG IV SOLR
INTRAVENOUS | Status: AC
Start: 2016-07-24 — End: 2016-07-24
  Administered 2016-07-24: 14:00:00
  Filled 2016-07-24: qty 1000

## 2016-07-24 MED ORDER — DEXTROSE 5 % IV SOLN
1.0000 g | INTRAVENOUS | Status: AC
  Administered 2016-07-24 – 2016-07-25 (×2): 1 g via INTRAVENOUS
  Filled 2016-07-24 (×2): qty 10

## 2016-07-24 NOTE — Evaluation (Signed)
Clinical/Bedside Swallow Evaluation Patient Details  Name: Caleb Christian MRN: [id number] Date of Birth: 11/05/1934  Today's Date: 07/24/2016 Time: SLP Start Time (ACUTE ONLY): 1140 SLP Stop Time (ACUTE ONLY): 1154 SLP Time Calculation (min) (ACUTE ONLY): 14 min  Past Medical History:  Past Medical History:  Diagnosis Date  . Quit smoking 1979   Past Surgical History: No past surgical history on file. HPI:  Caleb Christian is a 82 year old gentleman with PMH rheumatoid arthritis, CKD3, anemia of chronic disease, CAD s/p 2 stents and CABG, MI, aortic stenosis, prostate cancer, lymphoma, and PAD s/p L fem-pop bypass 01/2016 who presents with 2 days of worsening left leg swelling and erythema, along with episodes of confusion and nausea/vomiting. Per RN note, pt choking with liquids. CT chest shows granulomatous infection and mild diffuse bronchial wall thickening.    Assessment / Plan / Recommendation Clinical Impression  Pt demonstrates adequate tolerance of solids and liquids with no signs of aspiration. He does report a history of esophageal dysphagia with stricture and a description of probable dysmotility following esophageal assessment in [place]. He verbalizes safety precautions and is able to choose appropriate textures of foods. SLP encouraged additional precautions in hospital setting particularly due to decreased mobility and upright posture. Pt and RN verbalized understanding. No SLP f/u needed, pt may continue diet.     Aspiration Risk  Mild aspiration risk    Diet Recommendation Regular;Thin liquid   Liquid Administration via: Cup;Straw Medication Administration: Whole meds with puree Supervision: Patient able to self feed Compensations: Small sips/bites;Slow rate;Follow solids with liquid Postural Changes: Seated upright at 90 degrees;Remain upright for at least 30 minutes after po intake    Other  Recommendations Oral Care Recommendations: Oral care BID   Follow up  Recommendations  None    Frequency and Duration            Prognosis        Swallow Study   General HPI: Caleb Christian is a 82 year old gentleman with PMH rheumatoid arthritis, CKD3, anemia of chronic disease, CAD s/p 2 stents and CABG, MI, aortic stenosis, prostate cancer, lymphoma, and PAD s/p L fem-pop bypass 01/2016 who presents with 2 days of worsening left leg swelling and erythema, along with episodes of confusion and nausea/vomiting. Per RN note, pt choking with liquids. CT chest shows granulomatous infection and mild diffuse bronchial wall thickening.  Type of Study: Bedside Swallow Evaluation Previous Swallow Assessment: pt has had MBS and barium swallow in [place], has had esophagus dilated 6 times.  Diet Prior to this Study: Regular;Thin liquids Temperature Spikes Noted: No Respiratory Status: Room air History of Recent Intubation: No Behavior/Cognition: Alert;Cooperative;Pleasant mood Oral Cavity Assessment: Within Functional Limits Oral Care Completed by SLP: No Oral Cavity - Dentition: Dentures, top;Dentures, bottom Vision: Functional for self-feeding Self-Feeding Abilities: Able to feed self Patient Positioning: Upright in bed Baseline Vocal Quality: Normal Volitional Cough: Strong Volitional Swallow: Able to elicit    Oral/Motor/Sensory Function     Ice Chips     Thin Liquid Thin Liquid: Within functional limits Presentation: Cup;Straw;Self Fed    Nectar Thick Nectar Thick Liquid: Not tested   Honey Thick Honey Thick Liquid: Not tested   Puree Puree: Within functional limits   Solid   GO   Solid: Within functional limits       Herbie Baltimore, MA CCC-SLP [id number]  Hazleigh Mccleave, Katherene Ponto 07/24/2016,2:59 PM

## 2016-07-24 NOTE — Progress Notes (Signed)
  Date: 07/24/2016  Patient name: TAMARIUS EDELSTEIN  Medical record number: [id number]  Date of birth: 01/03/1934   I have seen and evaluated Jethro Bastos and discussed their care with the Residency Team. In brief, patient is a 82 y/o male with PMH of CKD stage 3, RA, anemia of chronic disease, CAD s/p 2 stents/CABG, AS, prostate Ca, PAD, lymphoma who p/w worsening LLE swelling and redness * 2 days. Patient loves near [place], [place] and was admitted there in May 2017 for L LE cellulitis that required IV abx. He notes that even after he completed the abx he had persistent L LE erythema to his mid calf. Over the last 2 days he noted increased L LE swelling and worsening erythema which tacked up to the side of his thigh. Last night he noted fevers and chills and had an episode of n/v and confusion. He has no memory of that episode. He denies CP. No sob, no abd pain, no diarrhea, no lightheadedness, no syncope, no focal weakness, no tingling/numbness, no palpitations, no diaphoresis He feels better today and is tolerating PO. No more fevers today   PMHx, Fam Hx, and/or Soc Hx : as per resident admit note  Vitals:   07/24/16 0533 07/24/16 1428  BP: (!) 106/57 (!) 109/58  Pulse: 72 70  Resp: 18 16  Temp: 98.2 F (36.8 C) 99.1 F (37.3 C)   Gen: AAO*3, NAD CVS: RRR, 3/6 systolic murmur + Lungs: CTA b/l Abd: soft, non tender, BS + Ext: LLE erythema, increased local warmth and swelling extending to midthigh    Assessment and Plan: I have seen and evaluated the patient as outlined above. I agree with the formulated Assessment and Plan as detailed in the residents' note, with the following changes:   1. Acute LLE Cellulitis: - Patient p/w worsening LLE swelling, erythema with assoc fevers and leukocytosis consistent with cellulitis.Patient with similar episode earlier this year requiring IV abx - Will c/w IV vancomycin and ceftriaxone for now - Will f/u blood cx- NGTD - LLE dopplers negative  for acute DVT  2. AKI on CKD: - Likely prerenal given n/v and decreased PO intake - Improving with IVF. Will monitor     Aldine Contes, MD 7/25/20173:55 PM

## 2016-07-24 NOTE — Evaluation (Signed)
Physical Therapy Evaluation Patient Details Name: Caleb Christian MRN: [id number] DOB: 10/29/1934 Today's Date: 07/24/2016   History of Present Illness  Patient is a 82 y/o male with hx of rheumatoid arthritis, CKD3, anemia of chronic disease, CAD s/p 2 stents and CABG, MI, aortic stenosis, prostate ca, lymphoma, and PAD s/p L fem-pop bypass 01/2016 who presents with 2 days of worsening left leg swelling, erythema and confusion.   Clinical Impression  Patient presents with redness/erythema LLE, generalized weakness, deconditioning and balance deficits s/p above. Pt independent, active and hunts PTA.  Tolerated gait training with Min guard-Min A for balance/safety due to being in bed since admission. Encouraged OOB to chair for all meals and ambulation 3x/day with nursing to improve strength and mobility. Encouraged use of RW if feeling unsteady. Wife and pt not sure if they ar going to [place] to see other family or will drive home to [place] after discharge. Will follow acutely to maximize independence and mobility prior to return home.    Follow Up Recommendations No PT follow up;Supervision for mobility/OOB    Equipment Recommendations  None recommended by PT    Recommendations for Other Services OT consult     Precautions / Restrictions Precautions Precautions: Fall Restrictions Weight Bearing Restrictions: No      Mobility  Bed Mobility Overal bed mobility: Needs Assistance Bed Mobility: Supine to Sit     Supine to sit: Modified independent (Device/Increase time);HOB elevated     General bed mobility comments: No assist needed. Use of rail.  Transfers Overall transfer level: Needs assistance Equipment used: None Transfers: Sit to/from Stand Sit to Stand: Min guard         General transfer comment: Min guard for safety due to mild unsteadiness. Reaching for IV pole.  Ambulation/Gait Ambulation/Gait assistance: Min guard;Min assist Ambulation Distance (Feet): 300  Feet Assistive device: None (IV pole) Gait Pattern/deviations: Step-through pattern;Decreased stride length;Trunk flexed;Narrow base of support;Staggering left;Staggering right Gait velocity: decreased   General Gait Details: Slow, staggering gait reaching for rail at times for support. Mild DOE. HR stable. Reports he does better with shoes on due to bone poking through his heel.  Stairs            Wheelchair Mobility    Modified Rankin (Stroke Patients Only)       Balance Overall balance assessment: Needs assistance Sitting-balance support: Feet supported;No upper extremity supported Sitting balance-Leahy Scale: Good Sitting balance - Comments: Able to reach outside BoS and donn socks without difficulty.    Standing balance support: During functional activity Standing balance-Leahy Scale: Fair                               Pertinent Vitals/Pain Pain Assessment: No/denies pain    Home Living Family/patient expects to be discharged to:: Private residence Living Arrangements: Spouse/significant other Available Help at Discharge: Family;Available 24 hours/day Type of Home: House Home Access: Stairs to enter Entrance Stairs-Rails: Right;Left;Can reach both Entrance Stairs-Number of Steps: 1 + 1 + 3  Home Layout: One level Home Equipment: Walker - 2 wheels;Crutches;Bedside commode;Cane - single point      Prior Function Level of Independence: Independent         Comments: Pt from [facility] and here to see his daughter. Possibly going to [place] to see his granddaughter. Loves to [employer].     Hand Dominance        Extremity/Trunk Assessment  Upper Extremity Assessment: Defer to OT evaluation           Lower Extremity Assessment:  (Swelling, erythema present distal to knee)         Communication   Communication: No difficulties  Cognition Arousal/Alertness: Awake/alert Behavior During Therapy: WFL for tasks assessed/performed Overall  Cognitive Status: Within Functional Limits for tasks assessed                      General Comments General comments (skin integrity, edema, etc.): Wife present during session.    Exercises        Assessment/Plan    PT Assessment Patient needs continued PT services  PT Diagnosis Difficulty walking;Generalized weakness   PT Problem List Decreased strength;Decreased mobility;Decreased balance;Decreased activity tolerance  PT Treatment Interventions Gait training;Therapeutic activities;Therapeutic exercise;Functional mobility training;Stair training;Balance training;Patient/family education   PT Goals (Current goals can be found in the Care Plan section) Acute Rehab PT Goals Patient Stated Goal: to get this leg better PT Goal Formulation: With patient Time For Goal Achievement: 08/07/16 Potential to Achieve Goals: Good    Frequency Min 3X/week   Barriers to discharge        Co-evaluation               End of Session Equipment Utilized During Treatment: Gait belt Activity Tolerance: Patient tolerated treatment well Patient left: in chair;with call bell/phone within reach;with family/visitor present Nurse Communication: Mobility status         Time: [id number] PT Time Calculation (min) (ACUTE ONLY): 26 min   Charges:   PT Evaluation $PT Eval Moderate Complexity: 1 Procedure PT Treatments $Gait Training: 8-22 mins   PT G Codes:        Yoshie Kosel A Ashling Roane 07/24/2016, 4:26 PM  Wray Kearns, [place], DPT [phone]

## 2016-07-24 NOTE — Progress Notes (Signed)
**  Preliminary results** Left lower extremity venous duplex completed. Left:  No evidence of DVT, superficial thrombosis, or Baker's cyst.  07/24/16 10:38 AM Caleb Christian RVT

## 2016-07-24 NOTE — Progress Notes (Signed)
Subjective: [name] is feeling well this morning with no complaints. He slept well overnight and tolerated dinner last night and breakfast this morning without issue. He denies any pain, nausea/vomiting, fever/chills, or worsening of his LLE swelling as far as he can tell. We discussed his medical treatment plan and the need for his CT scan yesterday evening along with the results. His LLE remains warm and erythematous tracking up over his knee and along medial and lateral thigh, sensation and pulses intact, minimal change from prior.   Objective: Vital signs in last 24 hours: Vitals:   07/23/16 1730 07/23/16 1837 07/23/16 2238 07/24/16 0533  BP: 111/69 (!) 112/55 (!) 103/54 (!) 106/57  Pulse: (!) 53 (!) 106 74 72  Resp: 14 16 18 18   Temp:  98 F (36.7 C) 99.3 F (37.4 C) 98.2 F (36.8 C)  TempSrc:  Oral Oral Oral  SpO2: 100% 90% 98% 99%  Weight:  62.1 kg (137 lb)    Height:  5\' 7"  (1.702 m)      Intake/Output Summary (Last 24 hours) at 07/24/16 1037 Last data filed at 07/24/16 0900  Gross per 24 hour  Intake             2880 ml  Output              525 ml  Net             2355 ml    Physical Exam General appearance: Thin elderly gentleman resting comfortably in stretcher, conversational and appears stated age Cardiovascular: Systolic crescendo-decrescendo murmur over LSB, regular rate and rhythm Respiratory: Clear to auscultation bilaterally, normal work of breathing Abdomen: BS+, soft, non-tender, non-distended Extremities/Back: Soft mobile mass over left scapula, 1+ pulses in LLE, otherwise 2+ throughout Skin: Warm, dry, diffuse chronic bruising over bilateral forearms, erythema and tense swelling of the LLE, tracks up medial and lateral thigh Neuro: Cranial nerves grossly intact, alert and oriented Psych: Appropriate affect  Labs / Imaging / Procedures: CBC Latest Ref Rng & Units 07/24/2016 07/23/2016  WBC 4.0 - 10.5 K/uL 15.3(H) 22.9(H)  Hemoglobin 13.0 - 17.0  g/dL 8.3(L) 10.1(L)  Hematocrit 39.0 - 52.0 % 27.1(L) 32.7(L)  Platelets 150 - 400 K/uL 165 212   BMP Latest Ref Rng & Units 07/24/2016 07/23/2016  Glucose 65 - 99 mg/dL 77 115(H)  BUN 6 - 20 mg/dL 33(H) 38(H)  Creatinine 0.61 - 1.24 mg/dL 1.78(H) 2.26(H)  Sodium 135 - 145 mmol/L 139 139  Potassium 3.5 - 5.1 mmol/L 3.7 4.0  Chloride 101 - 111 mmol/L 110 106  CO2 22 - 32 mmol/L 22 25  Calcium 8.9 - 10.3 mg/dL 7.7(L) 8.4(L)   Dg Chest 2 View  Result Date: 07/23/2016 CLINICAL DATA:  Patient with lower the redness and swelling. Prior CABG procedure. EXAM: CHEST  2 VIEW COMPARISON:  None. FINDINGS: Normal cardiac and mediastinal contours. Focal masslike area of consolidation within the right lower lung. No consolidative pulmonary opacities. No pleural effusion or pneumothorax. Left shoulder arthroplasty. Thoracic spine degenerative changes. Aortic vascular calcifications. IMPRESSION: Focal masslike area of consolidation within the right lower lung. This is nonspecific in etiology. While this may potentially be infectious in etiology, malignancy or other processes are not excluded. Recommend dedicated evaluation with chest CT. Aortic vascular calcifications. Electronically Signed   By: Lovey Newcomer M.D.   On: 07/23/2016 13:21  Ct Chest Wo Contrast  Result Date: 07/23/2016 CLINICAL DATA:  Follow-up lung consolidation EXAM: CT CHEST WITHOUT CONTRAST  TECHNIQUE: Multidetector CT imaging of the chest was performed following the standard protocol without IV contrast. COMPARISON:  Chest radiograph -07/23/2016 FINDINGS: Cardiovascular: Post median sternotomy. Extensive calcifications within native coronary arteries. Suspected calcifications within the aortic valve leaflets. Borderline cardiomegaly. There is diffuse decreased attenuation of the intra cardiac blood pool suggestive of anemia. Normal caliber the main pulmonary artery. Scattered atherosclerotic plaque within a normal caliber thoracic aorta. Note is  made of a ductus diverticulum. No definite intramural hematoma. Conventional configuration of the aortic arch. Mediastinum/Nodes: Partially calcified right infrahilar lymph node (image 67 and 68 series 25). No bulky mediastinal, hilar axillary adenopathy on this noncontrast examination. Lungs/Pleura: Multiple bilateral punctate calcified granuloma with dominant granuloma within the right upper lobe measuring 6 mm in diameter (image 20, series 205) and dominant granuloma within the left lower lobe measuring 4 mm (image 59), the sequela of prior granulomatous infection. Solitary punctate noncalcified nodule within the right upper lobe measure 3 mm in diameter (image 49, series 205). There is mild diffuse bronchial wall thickening however the central pulmonary airways remain widely patent. A small amount of fluid is seen tracking within the right minor (image 50, series 205; coronal image 73, series 203) and major (image 57, series 205) fissures as well as within the medial aspect of the left fissure (axial image 39, series 205, coronal image 96, 203). Minimal dependent subpleural ground-glass atelectasis. No discrete focal airspace opacities. No air bronchograms. Minimal pleural calcifications are seen within the left costophrenic angle (image 201, series 205). Upper Abdomen: Limited noncontrast evaluation of the upper abdomen demonstrates a small hiatal hernia. Post cholecystectomy. Musculoskeletal: No acute or aggressive osseous abnormalities. Mild degenerative change of the bilateral sternal mandibular joints. Post left hemi humeral arthroplasty, incompletely evaluated. Advanced degenerative change of the right glenohumeral joint with associated suspected subchondral cysts, incompletely evaluated. Regional soft tissues appear normal. Normal appearance of the thyroid gland. IMPRESSION: 1. No acute cardiopulmonary disease. 2. A small amount of fluid is seen tracking within the bilateral major and right minor fissures  which may correlate with the findings seen on preceding chest radiograph. 3. Sequela of prior granulomatous infection as above. 4. Solitary punctate (approximately 3 mm) noncalcified right upper lobe pulmonary nodule. No follow-up needed if patient is low-risk. Non-contrast chest CT can be considered in 12 months if patient is high-risk. This recommendation follows the consensus statement: Guidelines for Management of Incidental Pulmonary Nodules Detected on CT Images:From the Fleischner Society 2017; published online before print (10.1148/radiol.IJ:2314499). 5. Coronary artery calcifications. Aortic Atherosclerosis (ICD10-170.0) 6. Borderline cardiomegaly with suspected calcifications within the aortic valve leaflets. Further evaluation with cardiac echo could be performed as clinically indicated. Electronically Signed   By: Sandi Mariscal M.D.   On: 07/23/2016 20:35  [place] Chest  Result Date: 07/23/2016 CLINICAL DATA:  Palpable abnormality involving the left scapular area for 3 months. EXAM: CHEST ULTRASOUND COMPARISON:  None. FINDINGS: Ill-defined soft tissue mass correlating with the patient's palpable abnormality. This measures a maximum of 4.5 cm. It is most likely a subcutaneous lipoma. It is superficial to the underlying musculature. No significant blood flow. IMPRESSION: Probable benign subcutaneous lipoma over the left scapular area. Recommend correlation with scheduled CT scan. Electronically Signed   By: Marijo Sanes M.D.   On: 07/23/2016 20:12  VAS [place] LOWER EXTREMITY VENOUS LEFT 07/24/2016 Summary:  - No evidence of deep vein or superficial thrombosis involving the   left lower extremity and right common femoral vein. - No evidence of Baker&'s cyst on  the left.  Assessment/Plan: Mr. Caleb Christian is a 82 year old gentleman with PMH PAD s/p fem-pop bypass LLE, previous LLE cellulitis, Rheumatoid Arthritis, CAD s/p stents and CAG here for recurrence of LLE cellulitis  Cellulitis of LLE,  presented hypotensive and tachypneic, hemodynamics improved s/p 2L IVF, likely dehydration, very similar past hospitalization for LLE cellulitis in May                        - Vancomycin per pharmacy, discontinue zosyn given pseudomonas very unlikely   - Added IV Ceftriaxone 1g QD                       - mIVF, bolus as needed if poor po intake                       - LLE venous duplex ultrasound negative for DVT                       - Follow blood cultures                       - Hold home metoprolol given relative ongoing hypotension  Acute on chronic kidney disease, likely pre-renal due to dehydration and N/V, resolving, Cr 1.78 from 2.26                       - mIVF                       - Hold fenofibrate                       - Avoid NSAIDs, contrast, other nephrotoxic agents, renally-dose when possible  Lung nodule, CT chest completed, benign granuloma 41mm, RUL 6mm non-calcified nodule  - follow up at discretion of PCP  Functional status and diet, concern for dysphagia, history of dysphagia and esophageal dysmotility  - PT eval and treat  - SLP evaluated, continue current diet  CAD Anemia of chronic disease Rheumatoid arthritis  - Continue home medications  FEN/GI: NS IVF, regular diet thin fluids DVT ppx: Lovenox  Dispo: Anticipated discharge in approximately 2-3 day(s).   LOS: 1 day   Asencion Partridge, MD 07/24/2016, 10:37 AM Pager: [phone]

## 2016-07-25 ENCOUNTER — Telehealth: Payer: Self-pay | Admitting: Internal Medicine

## 2016-07-25 LAB — ABO/RH: ABO/RH(D): O POS

## 2016-07-25 LAB — CBC
HEMATOCRIT: 25.2 % — AB (ref 39.0–52.0)
HEMATOCRIT: 29.2 % — AB (ref 39.0–52.0)
HEMOGLOBIN: 7.9 g/dL — AB (ref 13.0–17.0)
Hemoglobin: 9 g/dL — ABNORMAL LOW (ref 13.0–17.0)
MCH: 28.3 pg (ref 26.0–34.0)
MCH: 27.9 pg (ref 26.0–34.0)
MCHC: 31.3 g/dL (ref 30.0–36.0)
MCHC: 30.8 g/dL (ref 30.0–36.0)
MCV: 90.3 fL (ref 78.0–100.0)
MCV: 90.4 fL (ref 78.0–100.0)
Platelets: 152 10*3/uL (ref 150–400)
Platelets: 187 10*3/uL (ref 150–400)
RBC: 2.79 MIL/uL — AB (ref 4.22–5.81)
RBC: 3.23 MIL/uL — ABNORMAL LOW (ref 4.22–5.81)
RDW: 16.2 % — ABNORMAL HIGH (ref 11.5–15.5)
RDW: 16.2 % — AB (ref 11.5–15.5)
WBC: 11.3 10*3/uL — ABNORMAL HIGH (ref 4.0–10.5)
WBC: 12.5 10*3/uL — AB (ref 4.0–10.5)

## 2016-07-25 LAB — BASIC METABOLIC PANEL
Anion gap: 8 (ref 5–15)
BUN: 22 mg/dL — AB (ref 6–20)
CHLORIDE: 111 mmol/L (ref 101–111)
CO2: 22 mmol/L (ref 22–32)
Calcium: 8.3 mg/dL — ABNORMAL LOW (ref 8.9–10.3)
Creatinine, Ser: 1.38 mg/dL — ABNORMAL HIGH (ref 0.61–1.24)
GFR calc Af Amer: 55 mL/min — ABNORMAL LOW (ref >60)
GFR calc non Af Amer: 47 mL/min — ABNORMAL LOW (ref >60)
GLUCOSE: 85 mg/dL (ref 65–99)
POTASSIUM: 3.6 mmol/L (ref 3.5–5.1)
Sodium: 141 mmol/L (ref 135–145)

## 2016-07-25 LAB — TYPE AND SCREEN
ABO/RH(D): O POS
Antibody Screen: NEGATIVE

## 2016-07-25 MED ORDER — CEPHALEXIN 500 MG PO CAPS: 500.0000 mg | ORAL_CAPSULE | Freq: Two times a day (BID) | ORAL | 0 refills | Status: AC

## 2016-07-25 MED ORDER — FENOFIBRATE 145 MG PO TABS: 145.0000 mg | ORAL_TABLET | Freq: Every day | ORAL | 0 refills | Status: AC

## 2016-07-25 MED ORDER — FERROUS SULFATE 325 (65 FE) MG PO TABS: 325.0000 mg | ORAL_TABLET | Freq: Every day | ORAL | 0 refills | Status: AC

## 2016-07-25 MED ORDER — ASPIRIN EC 81 MG PO TBEC: 81.0000 mg | DELAYED_RELEASE_TABLET | Freq: Every day | ORAL | 0 refills | Status: AC

## 2016-07-25 MED ORDER — PREDNISONE 1 MG PO TABS: 1.0000 mg | ORAL_TABLET | Freq: Every day | ORAL | 0 refills | Status: AC

## 2016-07-25 MED ORDER — OMEPRAZOLE 20 MG PO CPDR: 20.0000 mg | DELAYED_RELEASE_CAPSULE | Freq: Every day | ORAL | 0 refills | Status: AC

## 2016-07-25 MED ORDER — CEPHALEXIN 500 MG PO CAPS: 500.0000 mg | ORAL_CAPSULE | Freq: Two times a day (BID) | ORAL | Status: DC

## 2016-07-25 MED ORDER — METOPROLOL SUCCINATE ER 50 MG PO TB24: 50.0000 mg | ORAL_TABLET | Freq: Every day | ORAL | 0 refills | Status: AC

## 2016-07-25 MED ORDER — ENOXAPARIN SODIUM 40 MG/0.4ML ~~LOC~~ SOLN: 40.0000 mg | SUBCUTANEOUS | Status: DC

## 2016-07-25 MED ORDER — HYDROXYCHLOROQUINE SULFATE 200 MG PO TABS: 200.0000 mg | ORAL_TABLET | Freq: Every day | ORAL | 0 refills | Status: AC

## 2016-07-25 MED ORDER — PREDNISONE 5 MG PO TABS: 5.0000 mg | ORAL_TABLET | Freq: Every day | ORAL | 0 refills | Status: AC

## 2016-07-25 MED ORDER — TRAMADOL HCL 50 MG PO TABS
50.0000 mg | ORAL_TABLET | Freq: Every day | ORAL | 0 refills | Status: AC
Start: 2016-07-25 — End: ?

## 2016-07-25 MED ORDER — GABAPENTIN 100 MG PO CAPS: 100.0000 mg | ORAL_CAPSULE | Freq: Every day | ORAL | 0 refills | Status: AC

## 2016-07-25 NOTE — Care Management Important Message (Signed)
Important Message  Patient Details  Name: Caleb Christian MRN: [id number] Date of Birth: 29-Nov-1934   Medicare Important Message Given:  Yes    Loann Quill 07/25/2016, 1:18 PM

## 2016-07-25 NOTE — Progress Notes (Signed)
Physical Therapy Treatment Patient Details Name: ZAHID ARY MRN: [id number] DOB: 1934-06-13 Today's Date: 07/25/2016    History of Present Illness Patient is a 82 y/o male with hx of rheumatoid arthritis, CKD3, anemia of chronic disease, CAD s/p 2 stents and CABG, MI, aortic stenosis, prostate ca, lymphoma, and PAD s/p L fem-pop bypass 01/2016 who presents with 2 days of worsening left leg swelling, erythema and confusion.     PT Comments    Patient is progressing toward mobility goals. Tolerated gait/stair training. Pt reported that pain is improving and feeling better today. Current plan remains appropriate.   Follow Up Recommendations  No PT follow up;Supervision for mobility/OOB     Equipment Recommendations  None recommended by PT    Recommendations for Other Services OT consult     Precautions / Restrictions Precautions Precautions: Fall Restrictions Weight Bearing Restrictions: No    Mobility  Bed Mobility               General bed mobility comments: OOB in chair upon arrival  Transfers Overall transfer level: Needs assistance Equipment used: None Transfers: Sit to/from Stand Sit to Stand: Supervision         General transfer comment: supervision for safety; steady upon standing  Ambulation/Gait Ambulation/Gait assistance: Min guard Ambulation Distance (Feet): 400 Feet Assistive device: None (IV pole) Gait Pattern/deviations: Step-through pattern;Decreased stride length Gait velocity: decreased   General Gait Details: slow, steady gait; pushed IV pole initially but more steady without however unsteady with head turns and unable to increase gait velocity   Stairs Stairs: Yes Stairs assistance: Min guard Stair Management: One rail Left;Forwards Number of Stairs: 4 General stair comments: cues for step to pattern; no unsteadiness; no physical assist needed  Wheelchair Mobility    Modified Rankin (Stroke Patients Only)       Balance      Sitting balance-Leahy Scale: Good       Standing balance-Leahy Scale: Fair                      Cognition Arousal/Alertness: Awake/alert Behavior During Therapy: WFL for tasks assessed/performed Overall Cognitive Status: Within Functional Limits for tasks assessed                      Exercises      General Comments        Pertinent Vitals/Pain Pain Assessment: No/denies pain    Home Living                      Prior Function            PT Goals (current goals can now be found in the care plan section) Acute Rehab PT Goals Patient Stated Goal: get better Progress towards PT goals: Progressing toward goals    Frequency  Min 3X/week    PT Plan Current plan remains appropriate    Co-evaluation             End of Session Equipment Utilized During Treatment: Gait belt Activity Tolerance: Patient tolerated treatment well Patient left: in chair;with call bell/phone within reach;with family/visitor present     Time: 1130-1148 PT Time Calculation (min) (ACUTE ONLY): 18 min  Charges:  $Gait Training: 8-22 mins                    G Codes:      Salina April, PTA Pager: [phone]  07/25/2016, 2:03 PM

## 2016-07-25 NOTE — Telephone Encounter (Signed)
Pt need TOC discharge 07/25/16 HFU 07/30/16

## 2016-07-25 NOTE — Progress Notes (Signed)
Subjective: Caleb Christian is feeling wonderful today with no complaints. He walked up and down the hall with physical therapy and has been ambulatory and active in his room. He is eating well with no N/V. His leg swelling and redness continues to improve and is noticeably better compared to admission.   Objective: Vital signs in last 24 hours: Vitals:   07/24/16 0533 07/24/16 1428 07/24/16 2150 07/25/16 0512  BP: (!) 106/57 (!) 109/58 133/67 131/69  Pulse: 72 70 66 66  Resp: 18 16 16 16   Temp: 98.2 F (36.8 C) 99.1 F (37.3 C) 98.4 F (36.9 C) 98.2 F (36.8 C)  TempSrc: Oral Oral Oral Oral  SpO2: 99% 99% 100% 99%  Weight:      Height:        Intake/Output Summary (Last 24 hours) at 07/25/16 0810 Last data filed at 07/25/16 0802  Gross per 24 hour  Intake              980 ml  Output             1880 ml  Net             -900 ml    Physical Exam General appearance: Thin elderly gentleman resting comfortably in bed, conversational and appears stated age Cardiovascular: Systolic crescendo-decrescendo murmur over LSB, regular rate and rhythm Respiratory: Clear to auscultation bilaterally, normal work of breathing Abdomen: BS+, soft, non-tender, non-distended Skin: Warm, dry, diffuse chronic bruising over bilateral forearms, subtle erythema and  swelling of the LLE, minimal tracking proximally up medial thigh Neuro: Cranial nerves grossly intact, alert and oriented Psych: Appropriate affect  Labs / Imaging / Procedures: CBC Latest Ref Rng & Units 07/25/2016 07/24/2016 07/23/2016  WBC 4.0 - 10.5 K/uL 11.3(H) 15.3(H) 22.9(H)  Hemoglobin 13.0 - 17.0 g/dL 7.9(L) 8.3(L) 10.1(L)  Hematocrit 39.0 - 52.0 % 25.2(L) 27.1(L) 32.7(L)  Platelets 150 - 400 K/uL 152 165 212   BMP Latest Ref Rng & Units 07/25/2016 07/24/2016 07/23/2016  Glucose 65 - 99 mg/dL 85 77 115(H)  BUN 6 - 20 mg/dL 22(H) 33(H) 38(H)  Creatinine 0.61 - 1.24 mg/dL 1.38(H) 1.78(H) 2.26(H)  Sodium 135 - 145 mmol/L 141 139 139    Potassium 3.5 - 5.1 mmol/L 3.6 3.7 4.0  Chloride 101 - 111 mmol/L 111 110 106  CO2 22 - 32 mmol/L 22 22 25   Calcium 8.9 - 10.3 mg/dL 8.3(L) 7.7(L) 8.4(L)   No results found. VAS [place] LOWER EXTREMITY VENOUS LEFT 07/24/2016 Summary:  - No evidence of deep vein or superficial thrombosis involving the   left lower extremity and right common femoral vein. - No evidence of Baker&'s cyst on the left.  Assessment/Plan: Caleb Christian is a 82 year old gentleman with PMH PAD s/p fem-pop bypass LLE, previous LLE cellulitis, Rheumatoid Arthritis, CAD s/p stents and CAG here for recurrence of LLE cellulitis  Cellulitis of LLE, presented hypotensive and tachypneic, hemodynamics improved s/p 2L IVF, likely dehydration, very similar past hospitalization for LLE cellulitis in May                        - Dc vanc, 1 more dose of IV Ctx, discharge on 7 day course of Keflex                       - Negative blood cultures  Acute on chronic kidney disease, likely pre-renal due to dehydration and N/V, resolving                       -  Encourage good po intake                       - Avoid NSAIDs, contrast, other nephrotoxic agents, renally-dose when possible  Lung nodule, CT chest completed, benign granuloma 43mm, RUL 26mm non-calcified nodule  - follow up at discretion of PCP  Functional status and diet, concern for dysphagia, history of dysphagia and esophageal dysmotility  - No PT needs  - SLP evaluated, continue current diet  CAD Anemia of chronic disease Rheumatoid arthritis  - Continue home medications  FEN/GI: NS IVF, regular diet thin fluids DVT ppx: Lovenox  Dispo: Anticipated discharge today, will follow up next Monday in [facility] clinic.  LOS: 2 days   Asencion Partridge, MD 07/25/2016, 8:10 AM Pager: [phone]

## 2016-07-25 NOTE — Progress Notes (Addendum)
  Date: 07/25/2016  Patient name: Caleb Christian  Medical record number: [id number]  Date of birth: December 08, 1934   Patient seen and examined. Case d/w residents in detail. I agree with findings and plan as documented in Dr. Durenda Age note.  Patient with no new complaints. He is able to ambulate and his erythema and swelling in his R LE are decreasing. Leukocytosis continues to improve. Will d/c IV abx and start PO keflex to complete 7 day course. Will get outpatient f/u at [facility] for Monday. Blood cx with NGTD. Patient stable for d/c home today    Aldine Contes, MD 07/25/2016, 3:38 PM

## 2016-07-25 NOTE — Progress Notes (Signed)
Patient was discharged home by MD order; discharged instructions  review and give to patient with care notes and prescriptions; IV DIC; patient will be escorted to the car by nurse tech via wheelchair.  

## 2016-07-25 NOTE — Care Management Note (Signed)
Case Management Note  Patient Details  Name: Caleb Christian MRN: [id number] Date of Birth: [birth year]  Subjective/Objective:   82 y.o. M admitted 07/23/2016 for worsening of Erythema and swelling in LLE. Pt is s/p L Fem Pop bypass for PAD 01/2016. PT evaluation completed and no follow up recommendations at this time. Ind pta.                 Action/Plan: Anticipate discharge home today. No further CM needs but will be available should additional discharge needs arise.   Expected Discharge Date:                  Expected Discharge Plan:  Home/Self Care  In-House Referral:     Discharge planning Services  CM Consult  Post Acute Care Choice:    Choice offered to:     DME Arranged:    DME Agency:     HH Arranged:    HH Agency:     Status of Service:  In process, will continue to follow  If discussed at Long Length of Stay Meetings, dates discussed:    Additional Comments:  Delrae Sawyers, RN 07/25/2016, 2:56 PM

## 2016-07-26 NOTE — Discharge Summary (Signed)
Name: Caleb Christian MRN: [id number] DOB: [birth year] 82 y.o. PCP: No primary care provider on file.  Date of Admission: 07/23/2016  1:17 PM Date of Discharge: 07/25/2016 Attending Physician: Aldine Contes, MD  Discharge Diagnosis: 1. Cellulitis of the left leg  Principal Problem:   Pain and swelling of left lower leg Active Problems:   Sepsis ([place])   Lung nodule   Acute-on-chronic kidney injury ([place])   Discharge Medications:   Medication List    STOP taking these medications   acetaminophen 650 MG CR tablet Commonly known as:  TYLENOL   SLOW RELEASE IRON 45 MG Tbcr Generic drug:  Ferrous Sulfate Dried     TAKE these medications   aspirin EC 81 MG tablet Take 1 tablet (81 mg total) by mouth daily.   cephALEXin 500 MG capsule Commonly known as:  KEFLEX Take 1 capsule (500 mg total) by mouth every 12 (twelve) hours.   fenofibrate 145 MG tablet Commonly known as:  TRICOR Take 1 tablet (145 mg total) by mouth daily.   ferrous sulfate 325 (65 FE) MG tablet Take 1 tablet (325 mg total) by mouth daily with breakfast.   gabapentin 100 MG capsule Commonly known as:  NEURONTIN Take 1 capsule (100 mg total) by mouth at bedtime.   hydroxychloroquine 200 MG tablet Commonly known as:  PLAQUENIL Take 1 tablet (200 mg total) by mouth daily.   metoprolol succinate 50 MG 24 hr tablet Commonly known as:  TOPROL-XL Take 1 tablet (50 mg total) by mouth daily.   omeprazole 20 MG capsule Commonly known as:  PRILOSEC Take 1 capsule (20 mg total) by mouth daily.   predniSONE 1 MG tablet Commonly known as:  DELTASONE Take 1 tablet (1 mg total) by mouth daily with breakfast. What changed:  how much to take   predniSONE 5 MG tablet Commonly known as:  DELTASONE Take 1 tablet (5 mg total) by mouth daily with breakfast. What changed:  You were already taking a medication with the same name, and this prescription was added. Make sure you understand how and when to take  each.   traMADol 50 MG tablet Commonly known as:  ULTRAM Take 1 tablet (50 mg total) by mouth daily.       Disposition and follow-up:   Mr.Xavion L Agner was discharged from [facility] in Stable condition.  At the hospital follow up visit please address:  1.  Left leg cellulitis, improved rapidly with 3 days of IV antibiotics, discharged with 7 day course of Keflex. Photo below.  - Acute on chronic kidney injury, pre-renal / dehydration etiology likely, resolved by discharge with Cr 1.38 near baseline (1.2-1.3)  - Anemia of chronic disease, Hb as low as 7.9 during admission, never required transfusion, Hb 9.0 on day of discharge (baseline 9-10)  - 62mm RUL non-calcified pulmonary nodule seen on CT chest, imaging follow up at discretion of PCP (if patient considered high-risk, repeat non-con CT chest in 12 months)  2.  Labs / imaging needed at time of follow-up: CBC, BMP  3.  Pending labs/ test needing follow-up: Blood cultures x2 drawn 7/24  Follow-up Appointments: Follow-up Information    [place]. Go on 07/30/2016.   Why:  Hospital follow up visit 7/31 at 8:45AM Contact information: [address] [place] Course by problem list: Principal Problem:   Pain and swelling of left  lower leg Active Problems:   Sepsis ([place])   Lung nodule   Acute-on-chronic kidney injury ([place])   1. Cellulitis of left leg - patient with PMH PAD s/p fem-pop bypass 01/2016 presented on 7/24 with worsening LLE erythema and swelling tracking up his leg, nausea/vomiting, and recent confusion at home. He was hypotensive tachycardic on presentation but improved with fluid bolus and Vanc/Zosyn initiated. Narrowed down to Vanc/Ceftriaxone on 7/25, duplex US of LLE on 7/25 was negative for DVT and symptoms continued to improve. Patient was ambulatory/active and eating well with N/V resolved. By 7/26 patient  continued to improve and was discharge with 7 d course cephalexin.   2. Acute on chronic kidney injury - Cr 2.26 on admission, secondary to N/V poor po intake, resolved with IVF and good po intake to 1.38 by discharge   3. Anemia of chronic disease - Hb 10.1 on admission, dropped to 7.9 (likely dilutional due to IVF), recovered to 9.0 by discharge, no transfusions necessary, continued patient's home oral iron supplementation   4. Lung nodule - focal mass-like area of consolidation seen in RLL on CXR, CT chest on 7/24 demonstrated multiple bilateral punctate calcified granulomas (sequelae of prior granulomatous infection). Solitary punctate (approx 3 mm) noncalcified right upper lobe pulmonary nodule. No follow-up needed if patient is low-risk. Non-contrast chest CT can be considered in 12 months if patient is high-risk.   Discharge Vitals:   BP (!) 147/72 (BP Location: Right Arm)   Pulse 69   Temp 98.7 F (37.1 C)   Resp 18   Ht 5\' 7"  (1.702 m)   Wt 62.1 kg (137 lb)   SpO2 100%   BMI 21.46 kg/m   Pertinent Labs, Studies, and Procedures:   CBC Latest Ref Rng & Units 07/25/2016 07/25/2016 07/24/2016  WBC 4.0 - 10.5 K/uL 12.5(H) 11.3(H) 15.3(H)  Hemoglobin 13.0 - 17.0 g/dL 9.0(L) 7.9(L) 8.3(L)  Hematocrit 39.0 - 52.0 % 29.2(L) 25.2(L) 27.1(L)  Platelets 150 - 400 K/uL 187 152 165   BMP Latest Ref Rng & Units 07/25/2016 07/24/2016 07/23/2016  Glucose 65 - 99 mg/dL 85 77 115(H)  BUN 6 - 20 mg/dL 22(H) 33(H) 38(H)  Creatinine 0.61 - 1.24 mg/dL 1.38(H) 1.78(H) 2.26(H)  Sodium 135 - 145 mmol/L 141 139 139  Potassium 3.5 - 5.1 mmol/L 3.6 3.7 4.0  Chloride 101 - 111 mmol/L 111 110 106  CO2 22 - 32 mmol/L 22 22 25   Calcium 8.9 - 10.3 mg/dL 8.3(L) 7.7(L) 8.4(L)   Photo of left leg on day of discharge:   Dg Chest 2 View  Result Date: 07/23/2016 CLINICAL DATA:  Patient with lower the redness and swelling. Prior CABG procedure. EXAM: CHEST  2 VIEW COMPARISON:  None. FINDINGS: Normal cardiac and  mediastinal contours. Focal masslike area of consolidation within the right lower lung. No consolidative pulmonary opacities. No pleural effusion or pneumothorax. Left shoulder arthroplasty. Thoracic spine degenerative changes. Aortic vascular calcifications. IMPRESSION: Focal masslike area of consolidation within the right lower lung. This is nonspecific in etiology. While this may potentially be infectious in etiology, malignancy or other processes are not excluded. Recommend dedicated evaluation with chest CT. Aortic vascular calcifications. Electronically Signed   By: Lovey Newcomer M.D.   On: 07/23/2016 13:21  Ct Chest Wo Contrast  Result Date: 07/23/2016 CLINICAL DATA:  Follow-up lung consolidation EXAM: CT CHEST WITHOUT CONTRAST TECHNIQUE: Multidetector CT imaging of the chest was performed following the standard protocol without IV contrast. COMPARISON:  Chest radiograph -07/23/2016 FINDINGS: Cardiovascular:  Post median sternotomy. Extensive calcifications within native coronary arteries. Suspected calcifications within the aortic valve leaflets. Borderline cardiomegaly. There is diffuse decreased attenuation of the intra cardiac blood pool suggestive of anemia. Normal caliber the main pulmonary artery. Scattered atherosclerotic plaque within a normal caliber thoracic aorta. Note is made of a ductus diverticulum. No definite intramural hematoma. Conventional configuration of the aortic arch. Mediastinum/Nodes: Partially calcified right infrahilar lymph node (image 67 and 68 series 25). No bulky mediastinal, hilar axillary adenopathy on this noncontrast examination. Lungs/Pleura: Multiple bilateral punctate calcified granuloma with dominant granuloma within the right upper lobe measuring 6 mm in diameter (image 20, series 205) and dominant granuloma within the left lower lobe measuring 4 mm (image 59), the sequela of prior granulomatous infection. Solitary punctate noncalcified nodule within the right upper  lobe measure 3 mm in diameter (image 49, series 205). There is mild diffuse bronchial wall thickening however the central pulmonary airways remain widely patent. A small amount of fluid is seen tracking within the right minor (image 50, series 205; coronal image 73, series 203) and major (image 57, series 205) fissures as well as within the medial aspect of the left fissure (axial image 39, series 205, coronal image 96, 203). Minimal dependent subpleural ground-glass atelectasis. No discrete focal airspace opacities. No air bronchograms. Minimal pleural calcifications are seen within the left costophrenic angle (image 201, series 205). Upper Abdomen: Limited noncontrast evaluation of the upper abdomen demonstrates a small hiatal hernia. Post cholecystectomy. Musculoskeletal: No acute or aggressive osseous abnormalities. Mild degenerative change of the bilateral sternal mandibular joints. Post left hemi humeral arthroplasty, incompletely evaluated. Advanced degenerative change of the right glenohumeral joint with associated suspected subchondral cysts, incompletely evaluated. Regional soft tissues appear normal. Normal appearance of the thyroid gland. IMPRESSION: 1. No acute cardiopulmonary disease. 2. A small amount of fluid is seen tracking within the bilateral major and right minor fissures which may correlate with the findings seen on preceding chest radiograph. 3. Sequela of prior granulomatous infection as above. 4. Solitary punctate (approximately 3 mm) noncalcified right upper lobe pulmonary nodule. No follow-up needed if patient is low-risk. Non-contrast chest CT can be considered in 12 months if patient is high-risk. This recommendation follows the consensus statement: Guidelines for Management of Incidental Pulmonary Nodules Detected on CT Images:From the Fleischner Society 2017; published online before print (10.1148/radiol.IJ:2314499). 5. Coronary artery calcifications. Aortic Atherosclerosis  (ICD10-170.0) 6. Borderline cardiomegaly with suspected calcifications within the aortic valve leaflets. Further evaluation with cardiac echo could be performed as clinically indicated. Electronically Signed   By: Sandi Mariscal M.D.   On: 07/23/2016 20:35  [place] Chest  Addendum Date: 07/24/2016   ADDENDUM REPORT: 07/24/2016 14:56 ADDENDUM: I reviewed the patient's chest CT to correlate with the ultrasound examination. There is fatty atrophy of the rotator cuff muscles likely related to left shoulder arthroplasty. There is a fatty lesion in the subcutaneous fat overlying the left scapular area measuring approximately 5.3 x 1.7 cm. No worrisome CT findings. Patient's palpable abnormality corresponds to a simple appearing subcutaneous lipoma. Recommend clinical surveillance. Electronically Signed   By: Marijo Sanes M.D.   On: 07/24/2016 14:56  Result Date: 07/24/2016 CLINICAL DATA:  Palpable abnormality involving the left scapular area for 3 months. EXAM: CHEST ULTRASOUND COMPARISON:  None. FINDINGS: Ill-defined soft tissue mass correlating with the patient's palpable abnormality. This measures a maximum of 4.5 cm. It is most likely a subcutaneous lipoma. It is superficial to the underlying musculature. No significant blood flow. IMPRESSION: Probable  benign subcutaneous lipoma over the left scapular area. Recommend correlation with scheduled CT scan. Electronically Signed: By: Marijo Sanes M.D. On: 07/23/2016 20:12  VAS [place] LOWER EXT VENOUS (DVT) LEFT - 07/24/2016 Summary: - No evidence of deep vein or superficial thrombosis involving the   left lower extremity and right common femoral vein. - No evidence of Baker&'s cyst on the left.  Discharge Instructions: Discharge Instructions    Call MD for:  difficulty breathing, headache or visual disturbances    Complete by:  As directed   Call MD for:  extreme fatigue    Complete by:  As directed   Call MD for:  hives    Complete by:  As directed   Call MD for:   persistant dizziness or light-headedness    Complete by:  As directed   Call MD for:  persistant nausea and vomiting    Complete by:  As directed   Call MD for:  redness, tenderness, or signs of infection (pain, swelling, redness, odor or green/yellow discharge around incision site)    Complete by:  As directed   Call MD for:  severe uncontrolled pain    Complete by:  As directed   Call MD for:  temperature >100.4    Complete by:  As directed   Diet - low sodium heart healthy    Complete by:  As directed   Discharge instructions    Complete by:  As directed   Please continue to take your medications as prescribed and attend your scheduled hospital follow up visit on 07/30/2016. We have provided a 7 day course of oral antibiotics (Keflex) for you to take beginning tomorrow. This should clear up your cellulitis. Please call or visit the ER if your symptoms return or worsen. Thank you and best of luck with your recovery.   Increase activity slowly    Complete by:  As directed      Signed: Asencion Partridge, MD 07/26/2016, 4:22 PM   Pager: [phone]

## 2016-07-28 LAB — CULTURE, BLOOD (ROUTINE X 2)
CULTURE: NO GROWTH
CULTURE: NO GROWTH

## 2016-07-30 ENCOUNTER — Ambulatory Visit (INDEPENDENT_AMBULATORY_CARE_PROVIDER_SITE_OTHER): Payer: Medicare Other | Admitting: Internal Medicine

## 2016-07-30 ENCOUNTER — Encounter: Payer: Self-pay | Admitting: Internal Medicine

## 2016-07-30 DIAGNOSIS — B9689 Other specified bacterial agents as the cause of diseases classified elsewhere: Secondary | ICD-10-CM | POA: Diagnosis not present

## 2016-07-30 DIAGNOSIS — Z87891 Personal history of nicotine dependence: Secondary | ICD-10-CM

## 2016-07-30 DIAGNOSIS — L03116 Cellulitis of left lower limb: Secondary | ICD-10-CM

## 2016-07-30 DIAGNOSIS — R911 Solitary pulmonary nodule: Secondary | ICD-10-CM | POA: Diagnosis not present

## 2016-07-30 NOTE — Patient Instructions (Signed)
Thank you for coming to see me today. It was a pleasure. Today we talked about:   Your Cellulitis: - Please complete your course of antibiotics. - Please schedule an appointment with your primary care doctor back in [place] - Please stop every 3-4 hours and move around on your way back home - Please mention the lung nodule seen on CT scan of your chest.  Non-contrast chest CT can be considered in 12 months based on the radiologist recommendations.   Please follow-up with your Primary Doctor when you get home.

## 2016-07-30 NOTE — Assessment & Plan Note (Addendum)
A: Patient doing well after his hospitalization with improvement of his erythema and a resolving cellulitis.  He is tolerating Keflex PO without any side effects at this time.  P: - finish Keflex as directed. - recommended frequent stops during trip back to [place]. - recommended follow up with PCP in [place] to review his hospitalization.

## 2016-07-30 NOTE — Progress Notes (Signed)
Internal Medicine Clinic Attending  Case discussed with Dr. Wallace at the time of the visit.  We reviewed the resident's history and exam and pertinent patient test results.  I agree with the assessment, diagnosis, and plan of care documented in the resident's note.  

## 2016-07-30 NOTE — Assessment & Plan Note (Signed)
A: Focal mass-like area of consolidation seen in RLL on CXR, CT chest on 7/24 demonstrated multiple bilateral punctate calcified granulomas (sequelae of prior granulomatous infection). Solitary punctate (approx 3 mm) noncalcified right upper lobe pulmonary nodule. No follow-up needed if patient is low-risk. Non-contrast chest CT can be considered in 12 months if patient is high-risk.   P: - patient reports he quit smoking in 1979 - recommended he and his wife discuss with his PCP in [place] regarding further follow up if needed.

## 2016-07-30 NOTE — Progress Notes (Signed)
   CC: hospital follow up for cellulitis  HPI:  [name] is a 82 y.o. man who is here today for hosptial follow up for left lower extremity cellulitis.  He was admitted from 7/24-7/26 and treated with IV antibiotics.  He was hypotensive and tachycardic on admission but this resolved with IV fluids.  Lower extremity dopplers ruled out DVT.  He had a recent hospitalization in May 2017 for left leg cellulitis that required 9 days of IV antibiotics. At that time he was discharged on 7 days of Amoxacillin and the swelling and pain in his left leg improved, but the erythema (from foot to mid-calf) has persisted and this was felt to be benign by his care team.  Since hospitalization, he reports the erythema, while still present, has improved and has receded to below his knee at this point.  He denies pain, fever, chills, nausea, vomiting.  He and his wife are planning to travel back to [place] today.  Past Medical History:  Diagnosis Date  . Anemia   . Arthritis   . Quit smoking 1979    Review of Systems:   Negative except as noted in HPI  Physical Exam:  Vitals:   07/30/16 0914  BP: 139/62  Pulse: 67  Temp: 98.2 F (36.8 C)  TempSrc: Oral  SpO2: 100%  Weight: 134 lb (60.8 kg)   Physical Exam  Constitutional: He is oriented to person, place, and time and well-developed, well-nourished, and in no distress.  HENT:  Head: Normocephalic and atraumatic.  Eyes: EOM are normal.  Neurological: He is alert and oriented to person, place, and time.  Skin:  Chronic bruising of bilateral forearms. Warm, dry, flaking skin with blanching erythema extending to the tibial region of left leg.  Mild edema.  Pulses intact. Evidence of prior revascularization surgery.  Psychiatric: Mood and affect normal.     Assessment & Plan:   See Encounters Tab for problem based charting.   Patient discussed with Dr. Daryll Drown   Cellulitis of leg, left A: Patient doing well after his hospitalization with  improvement of his erythema and a resolving cellulitis.  He is tolerating Keflex PO without any side effects at this time.  P: - finish Keflex as directed. - recommended frequent stops during trip back to [place]. - recommended follow up with PCP in [place] to review his hospitalization.  Lung nodule A: Focal mass-like area of consolidation seen in RLL on CXR, CT chest on 7/24 demonstrated multiple bilateral punctate calcified granulomas (sequelae of prior granulomatous infection). Solitary punctate (approx 3 mm) noncalcified right upper lobe pulmonary nodule. No follow-up needed if patient is low-risk. Non-contrast chest CT can be considered in 12 months if patient is high-risk.   P: - patient reports he quit smoking in 1979 - recommended he and his wife discuss with his PCP in [place] regarding further follow up if needed.

## 2016-07-31 NOTE — Telephone Encounter (Signed)
Patient seen in clinic 07/30/16 for hospital follow up where all needs were addressed.

## 2017-11-17 IMAGING — US US CHEST/MEDIASTINUM
1 series · 14 of 16 positions shown · non-contrast
Comparison: None.

ADDENDUM:
I reviewed the patient's chest CT to correlate with the ultrasound
examination. There is fatty atrophy of the rotator cuff muscles
likely related to left shoulder arthroplasty. There is a fatty
lesion in the subcutaneous fat overlying the left scapular area
measuring approximately 5.3 x 1.7 cm. No worrisome CT findings.

Patient's palpable abnormality corresponds to a simple appearing
subcutaneous lipoma. Recommend clinical surveillance.
CLINICAL DATA: Palpable abnormality involving the left scapular
area for 3 months.
EXAM:
CHEST ULTRASOUND

[Series 1: us chest/mediastinum · 0.06mm/px · 16 acquisitions, 14 frames shown]
[im 1/16]
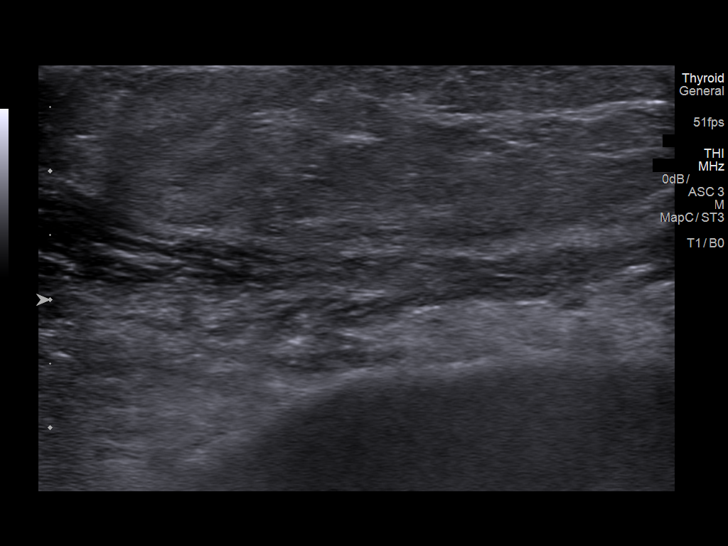
[im 2/16]
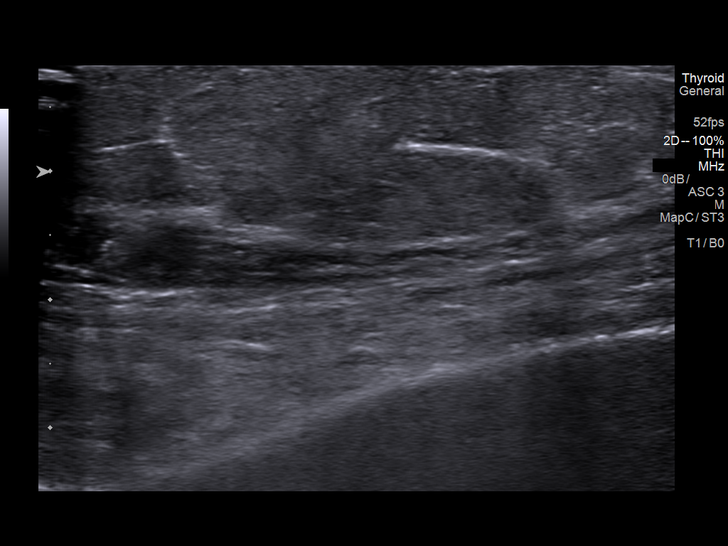
[im 3/16]
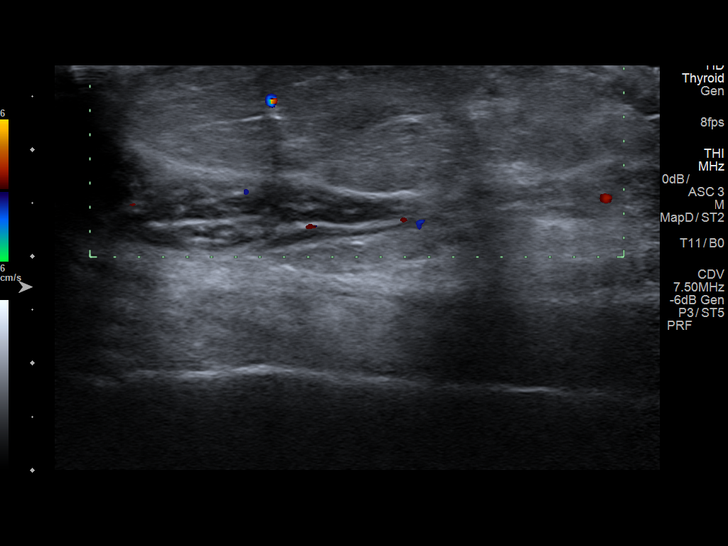
[im 5/16]
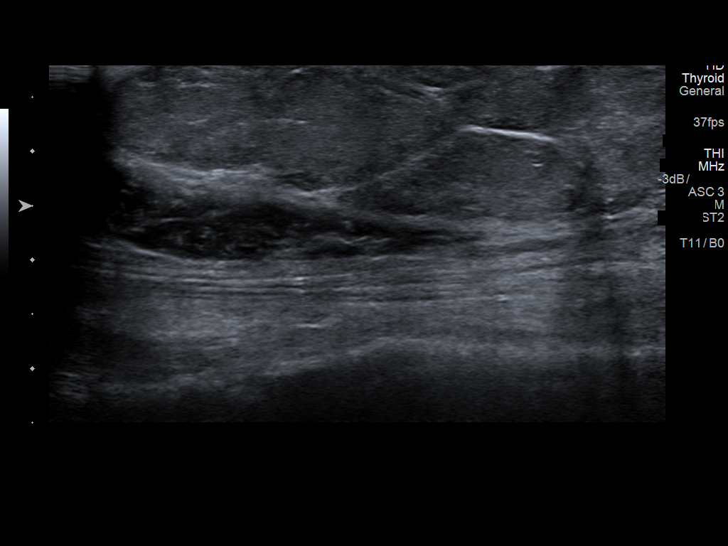
[im 6/16]
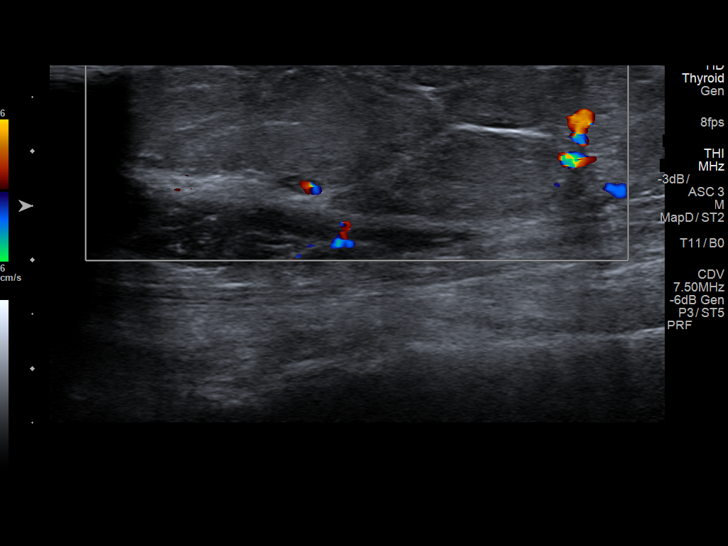
[im 7/16]
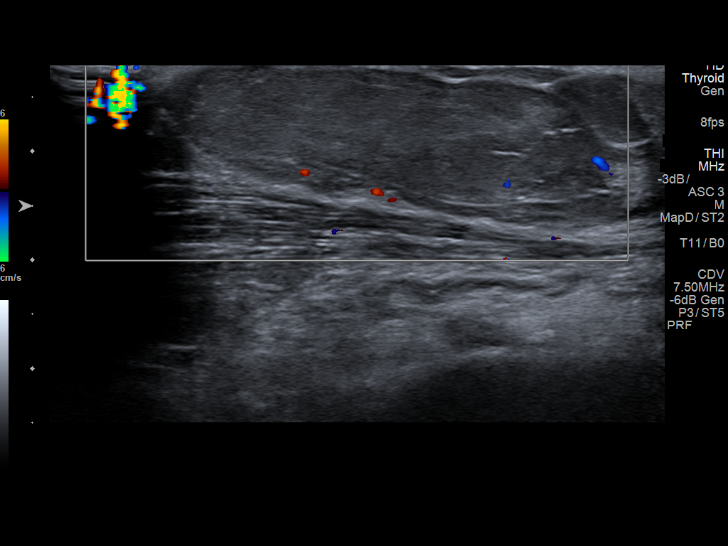
[im 8/16]
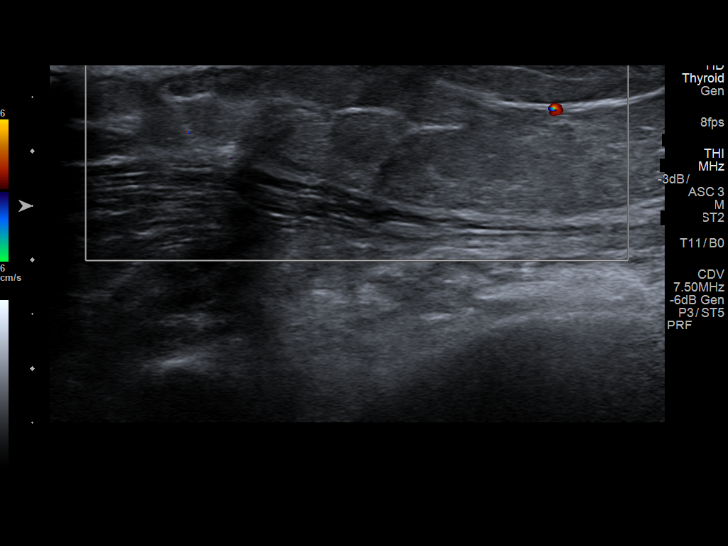
[im 9/16]
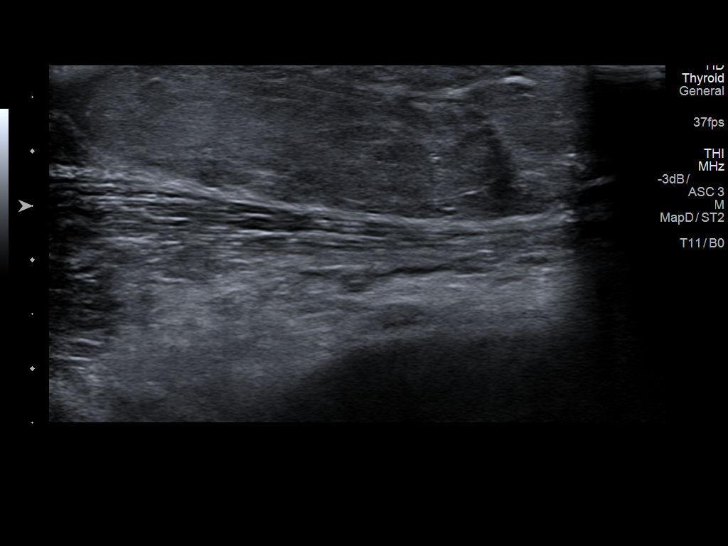
[im 10/16]
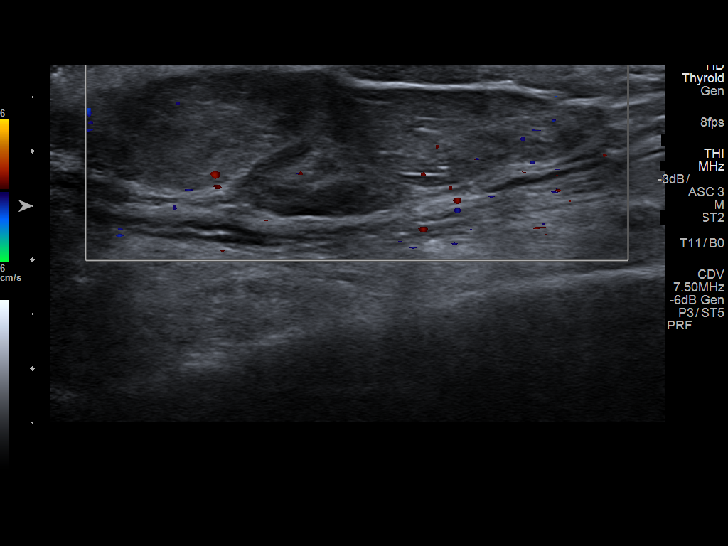
[im 11/16]
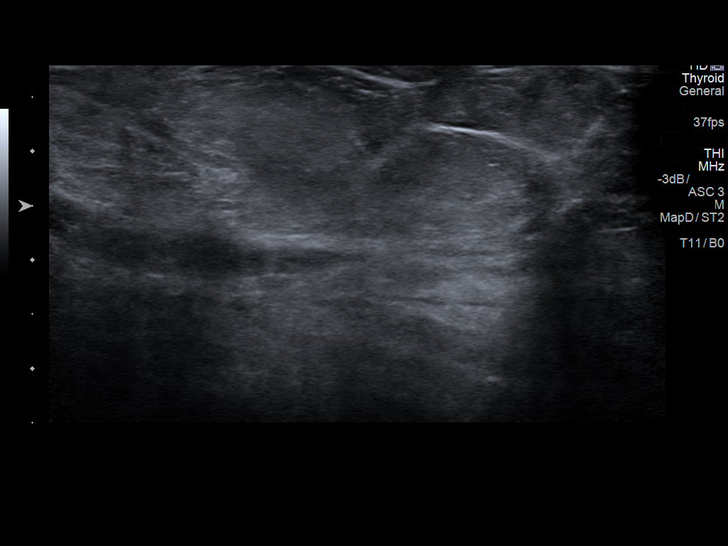
[im 13/16]
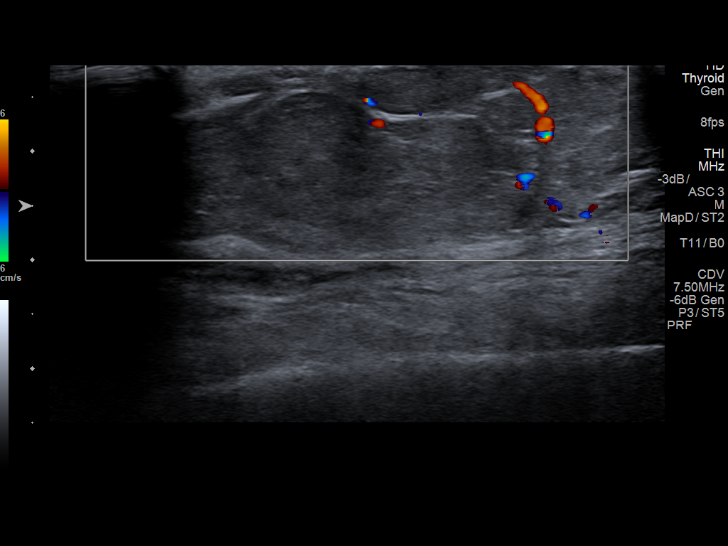
[im 14/16]
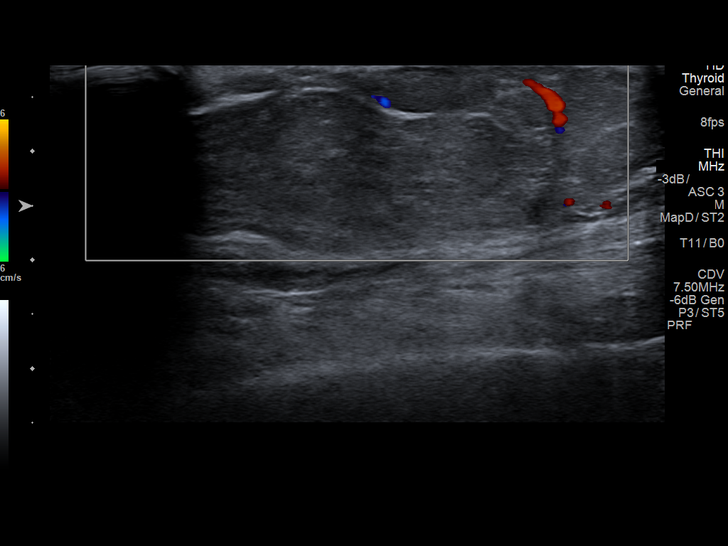
[im 15/16]
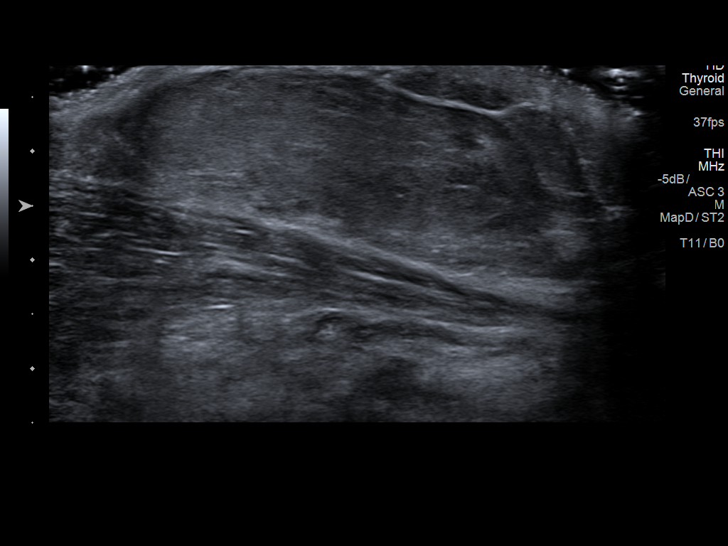
[im 16/16]
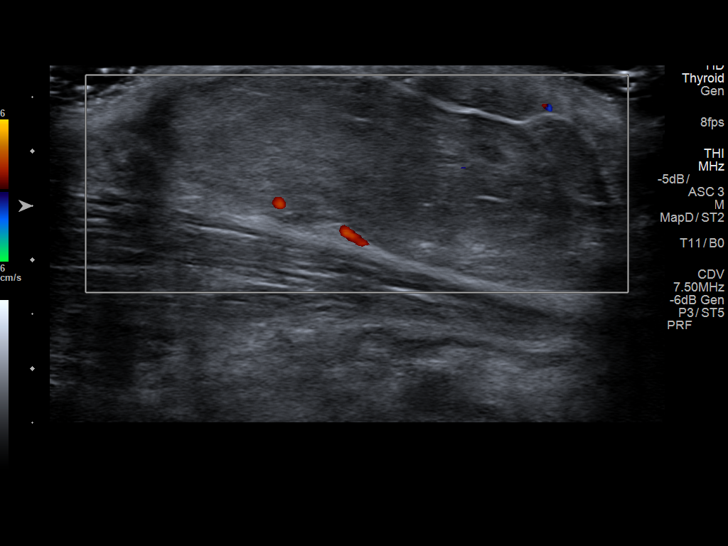

[14 of 16 positions shown; findings below may reference images not displayed]

FINDINGS: Ill-defined soft tissue mass correlating with the patient's palpable
abnormality. This measures a maximum of 4.5 cm. It is most likely a
subcutaneous lipoma. It is superficial to the underlying
musculature. No significant blood flow.
IMPRESSION: Probable benign subcutaneous lipoma over the left scapular area.
Recommend correlation with scheduled CT scan.

## 2017-12-14 IMAGING — DX DG CHEST 2V
2 series · 2 of 2 positions shown · non-contrast
Comparison: None.

CLINICAL DATA: Patient with lower the redness and swelling. Prior
CABG procedure.

EXAM:
CHEST  2 VIEW

[w chest pa]
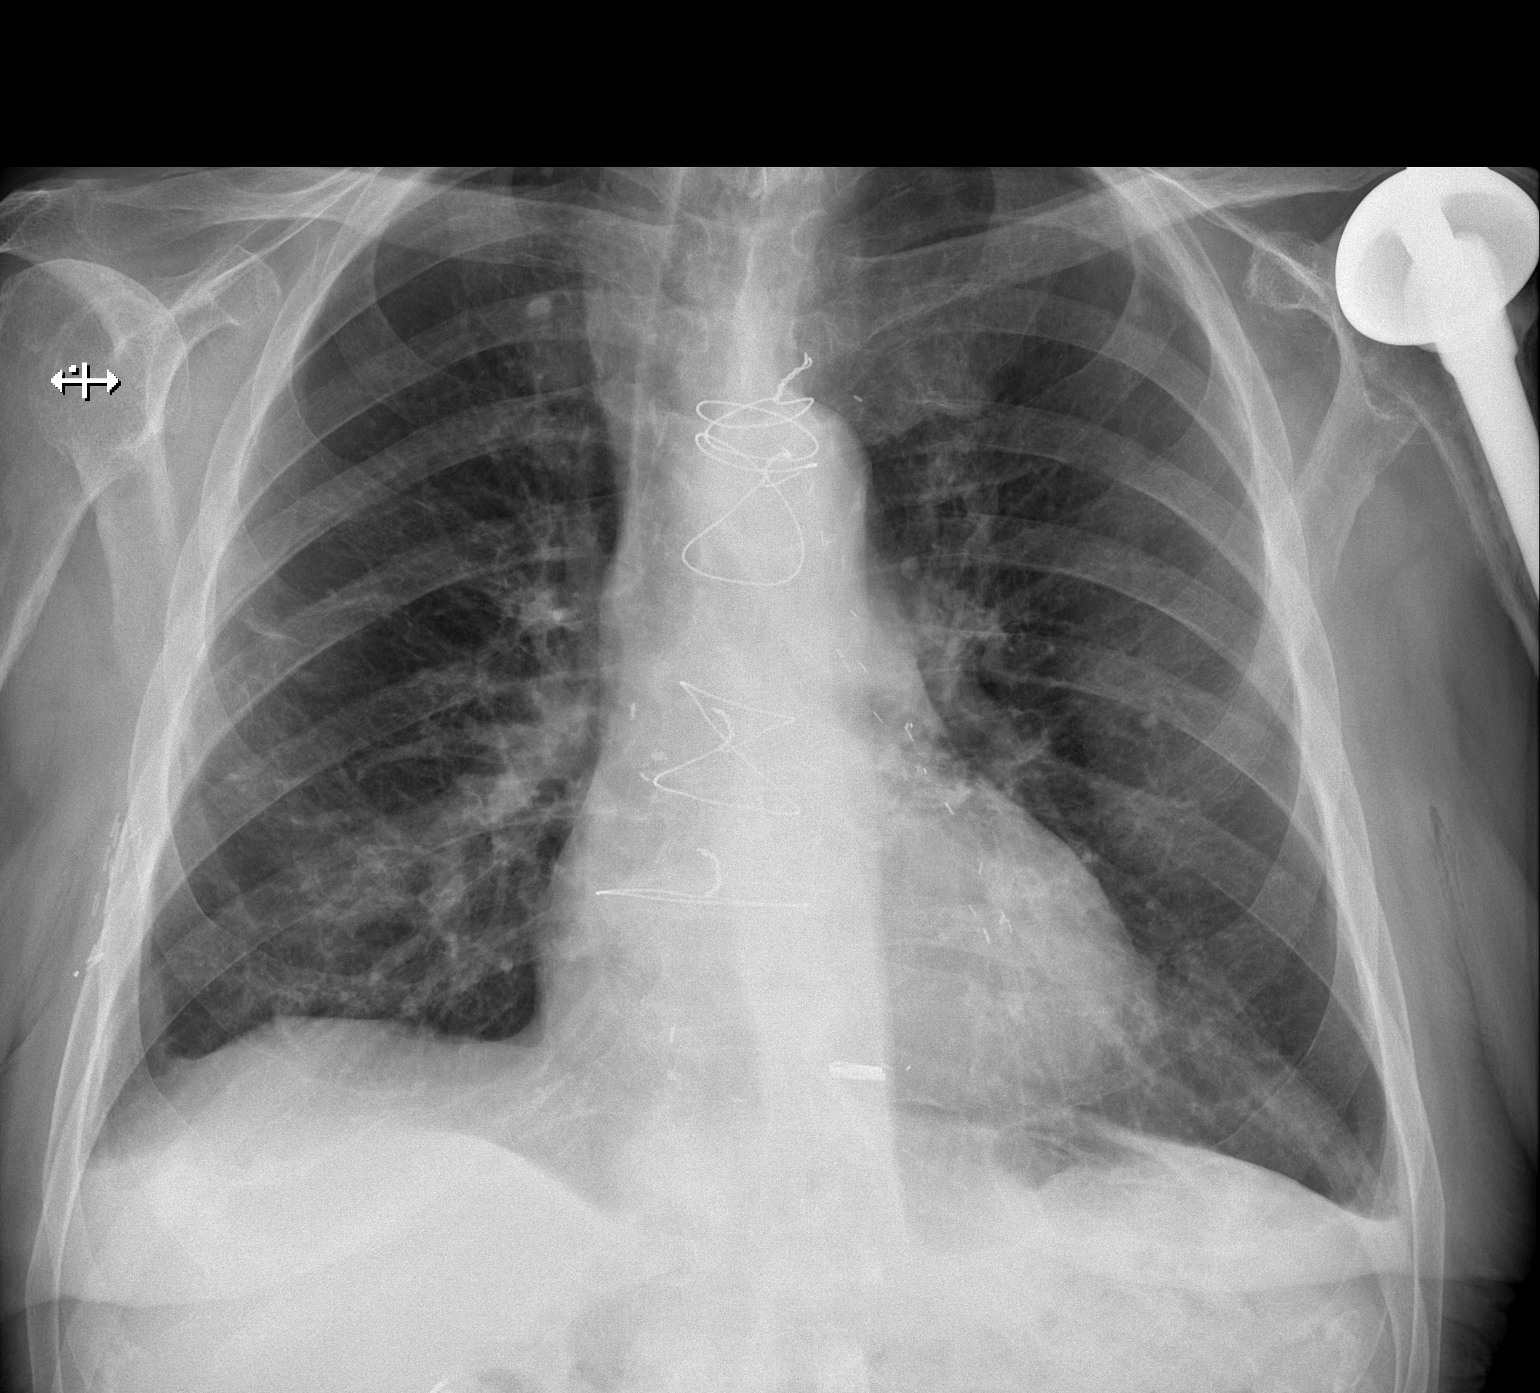

[w chest lat]
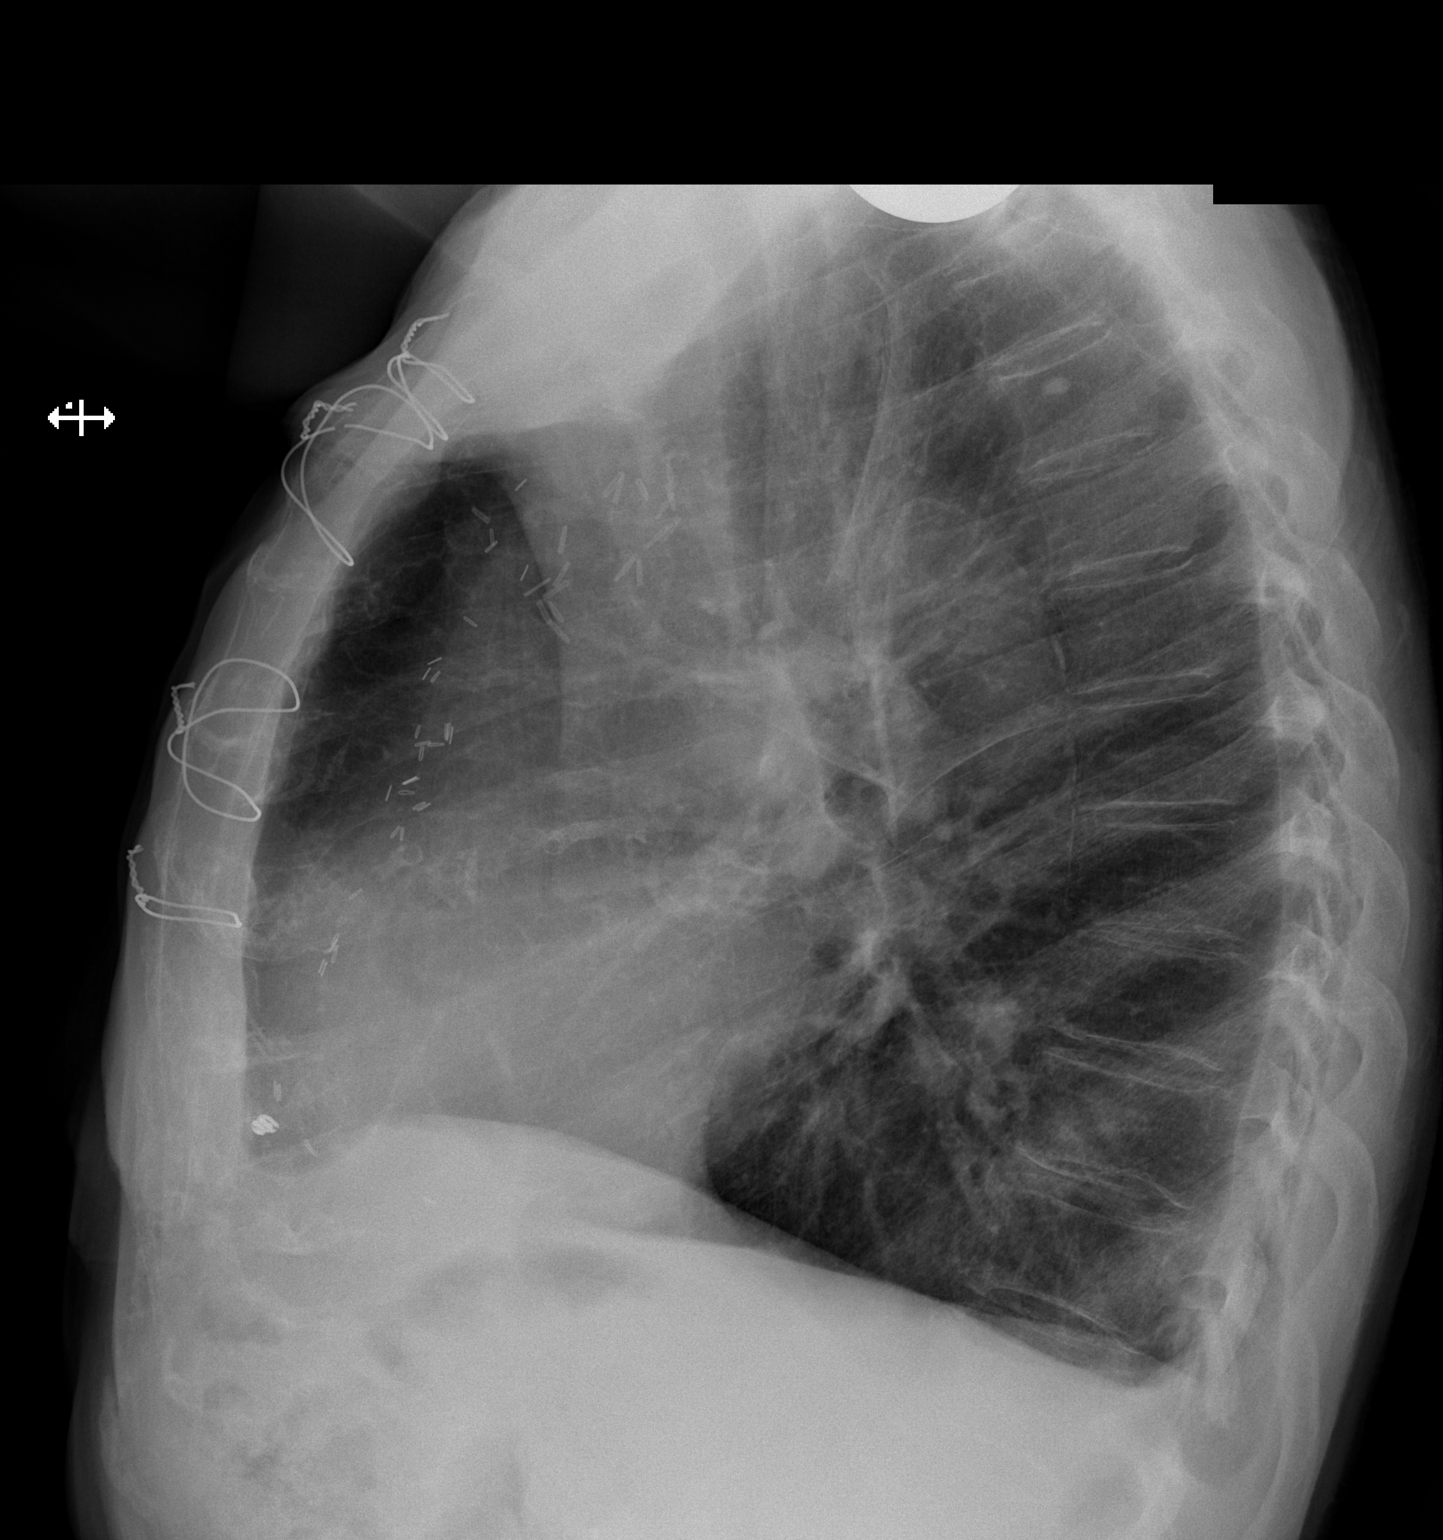

[2 of 2 positions shown; findings below may reference images not displayed]

FINDINGS: Normal cardiac and mediastinal contours. Focal masslike area of
consolidation within the right lower lung. No consolidative
pulmonary opacities. No pleural effusion or pneumothorax. Left
shoulder arthroplasty. Thoracic spine degenerative changes. Aortic
vascular calcifications.
IMPRESSION: Focal masslike area of consolidation within the right lower lung.
This is nonspecific in etiology. While this may potentially be
infectious in etiology, malignancy or other processes are not
excluded. Recommend dedicated evaluation with chest CT.

Aortic vascular calcifications.

## 2017-12-14 IMAGING — CT CT CHEST W/O CM
2 of 3 series · 12 of 36 positions shown, 15 images · non-contrast
Comparison: Chest radiograph -07/23/2016

CLINICAL DATA: Follow-up lung consolidation

EXAM:
CT CHEST WITHOUT CONTRAST
TECHNIQUE: Multidetector CT imaging of the chest was performed following the
standard protocol without IV contrast.

[Series 201: chest without, idose (2) · axial · non-contrast · 0.66mm/px · z∈[+903,+1163]mm · 9 of 122 slices shown, 12 images]
[im 9/122  mediastinal]
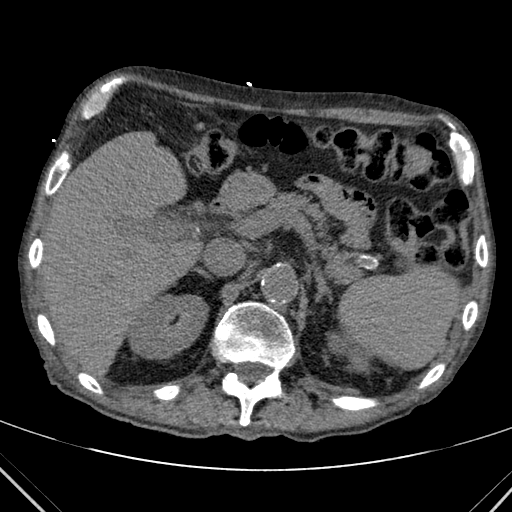
[im 9/122  lung]
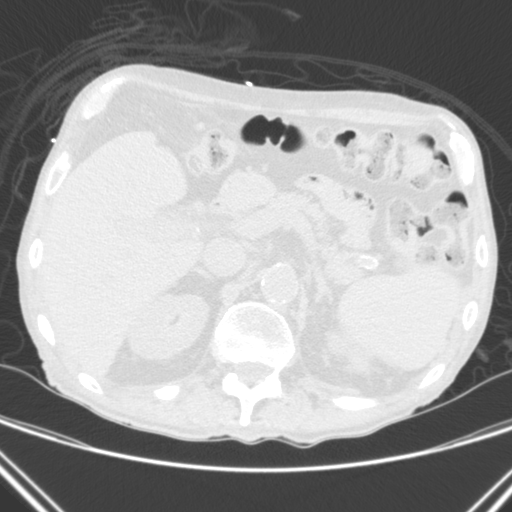
[im 23/122  lung]
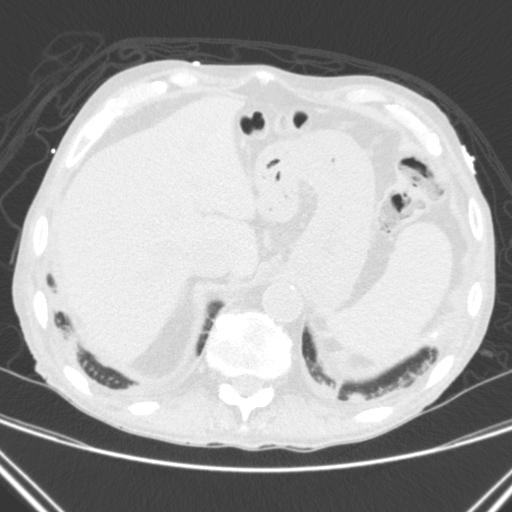
[im 36/122  lung]
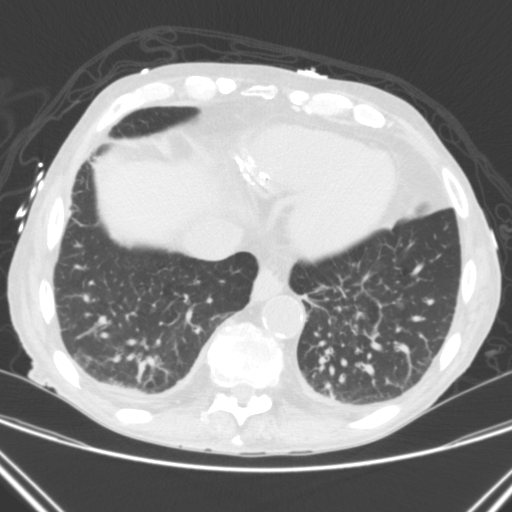
[im 50/122  lung]
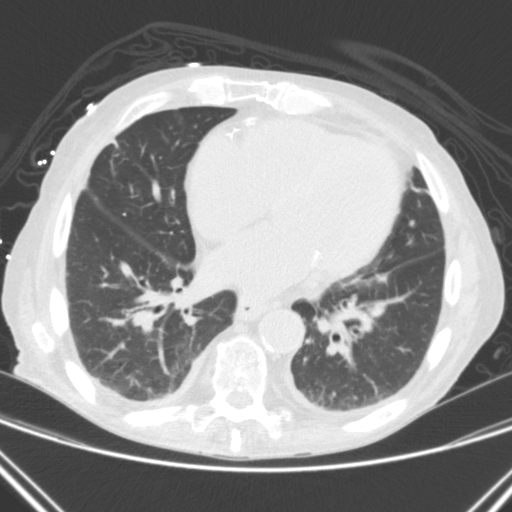
[im 63/122  mediastinal]
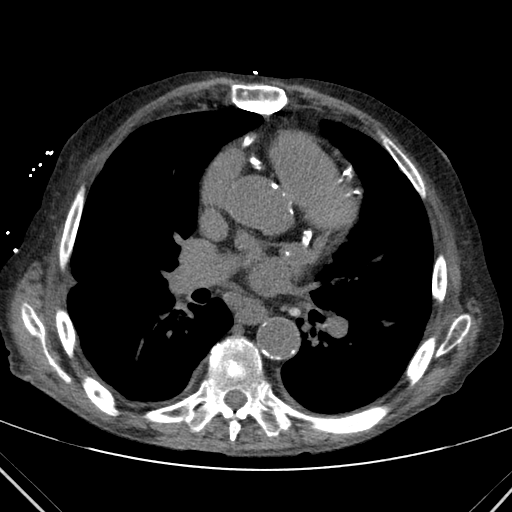
[im 63/122  lung]
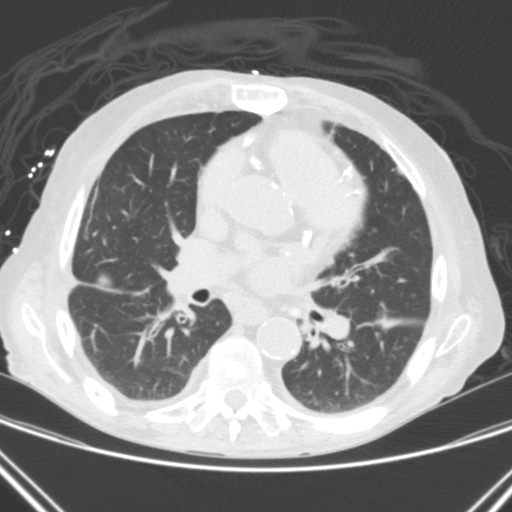
[im 72/122  lung]
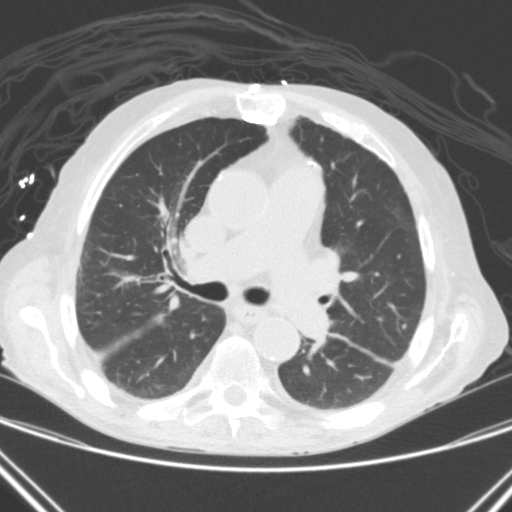
[im 86/122  lung]
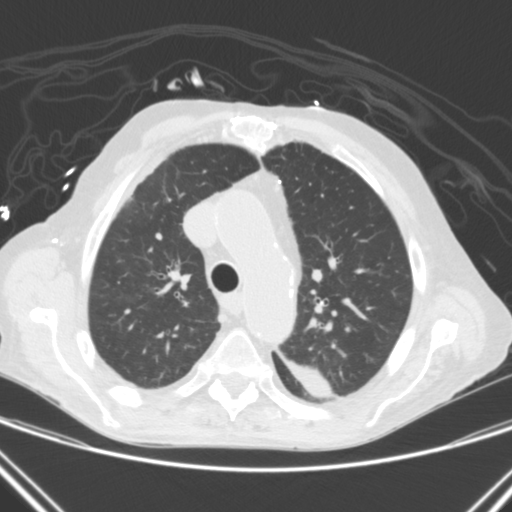
[im 99/122  lung]
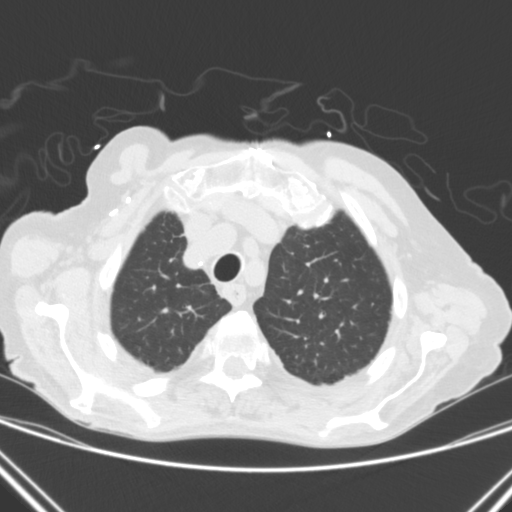
[im 113/122  mediastinal]
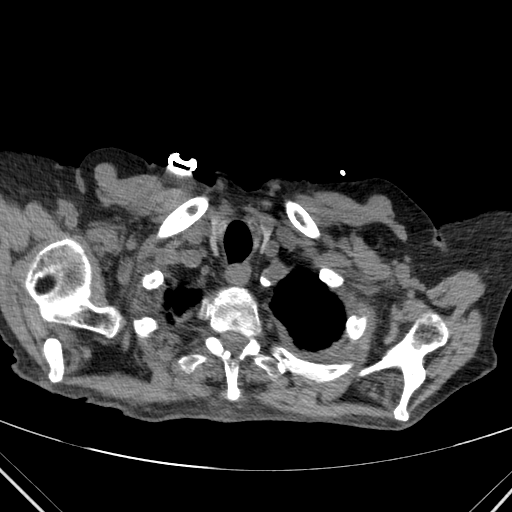
[im 113/122  lung]
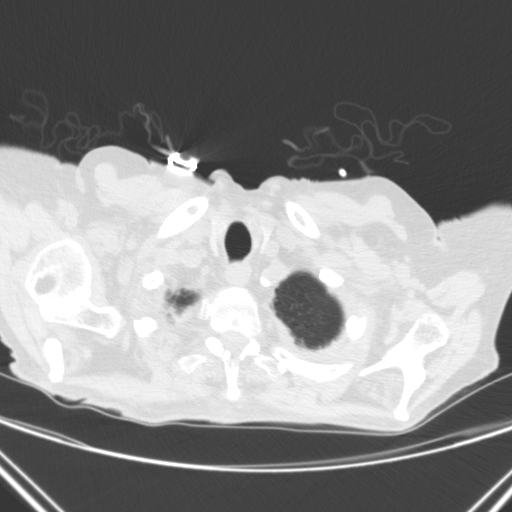

[Series 203: coronal, idose (2) · coronal · 0.45mm/px · 3 of 129 slices shown]
[im 26/129  lung]
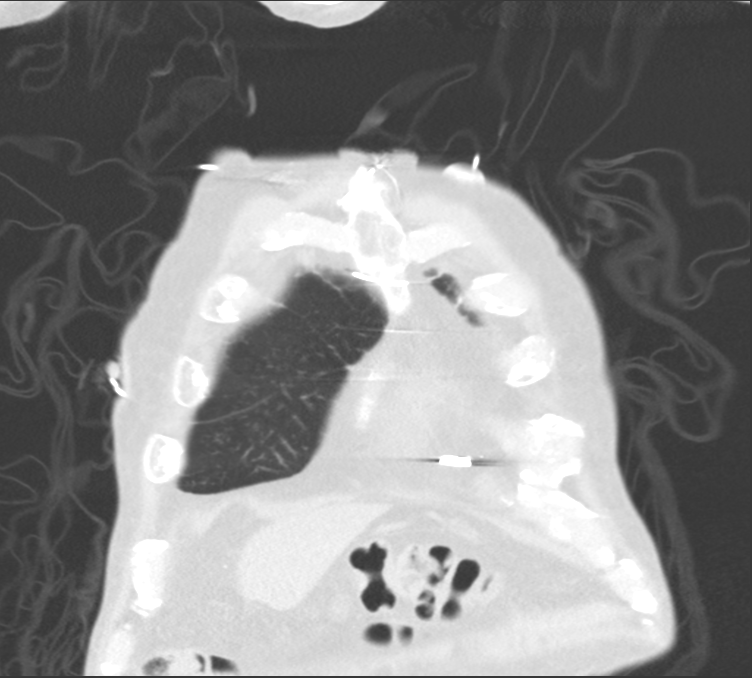
[im 52/129  lung]
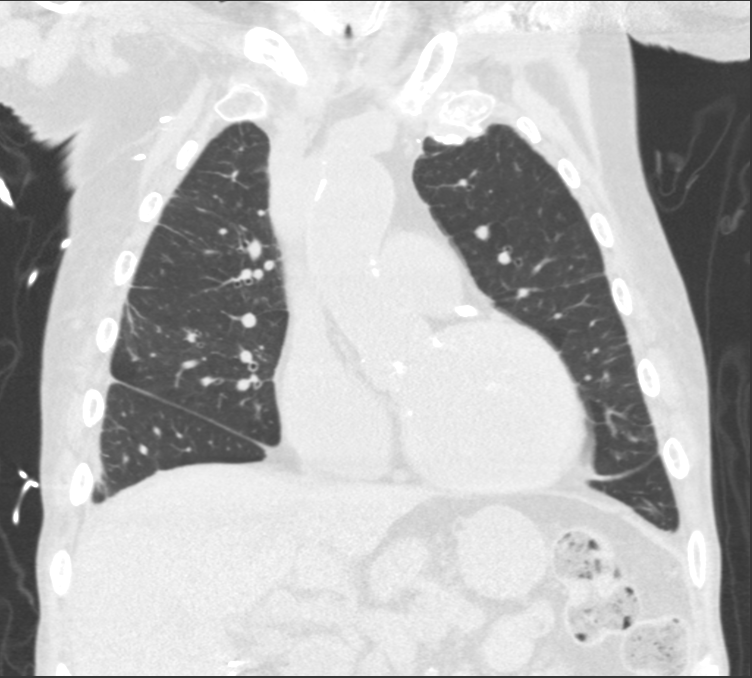
[im 77/129  lung]
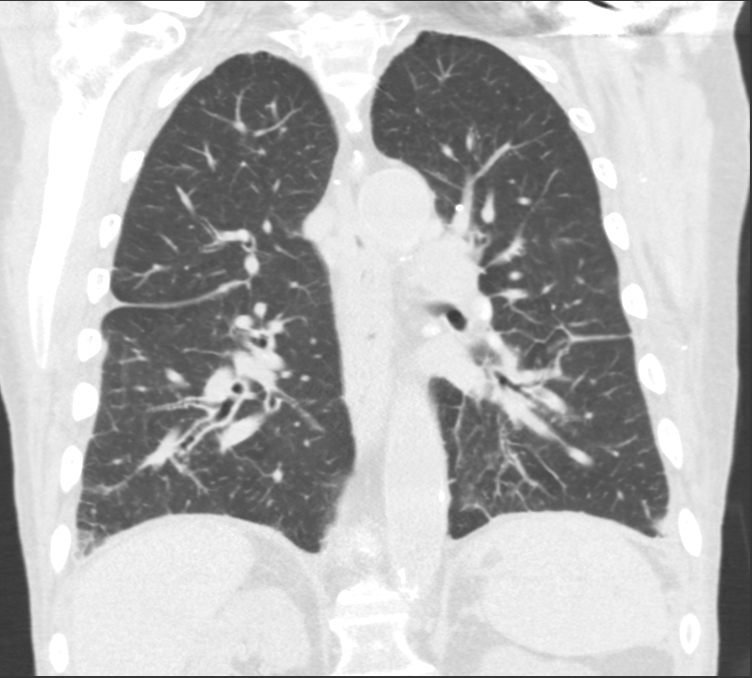

[12 of 36 positions shown; findings below may reference images not displayed]

FINDINGS: Cardiovascular: Post median sternotomy. Extensive calcifications
within native coronary arteries. Suspected calcifications within the
aortic valve leaflets. Borderline cardiomegaly. There is diffuse
decreased attenuation of the intra cardiac blood pool suggestive of
anemia. Normal caliber the main pulmonary artery. Scattered
atherosclerotic plaque within a normal caliber thoracic aorta. Note
is made of a ductus diverticulum. No definite intramural hematoma.
Conventional configuration of the aortic arch.

Mediastinum/Nodes: Partially calcified right infrahilar lymph node
(image 67 and 68 series 25). No bulky mediastinal, hilar axillary
adenopathy on this noncontrast examination.

Lungs/Pleura: Multiple bilateral punctate calcified granuloma with
dominant granuloma within the right upper lobe measuring 6 mm in
diameter (image 20, series 205) and dominant granuloma within the
left lower lobe measuring 4 mm (image 59), the sequela of prior
granulomatous infection.

Solitary punctate noncalcified nodule within the right upper lobe
measure 3 mm in diameter (image 49, series 205).

There is mild diffuse bronchial wall thickening however the central
pulmonary airways remain widely patent.

A small amount of fluid is seen tracking within the right minor
(image 50, series 205; coronal image 73, series 203) and major
(image 57, series 205) fissures as well as within the medial aspect
of the left fissure (axial image 39, series 205, coronal image 96,
203).

Minimal dependent subpleural ground-glass atelectasis. No discrete
focal airspace opacities. No air bronchograms.

Minimal pleural calcifications are seen within the left costophrenic
angle (image 201, series 205).

Upper Abdomen: Limited noncontrast evaluation of the upper abdomen
demonstrates a small hiatal hernia. Post cholecystectomy.

Musculoskeletal: No acute or aggressive osseous abnormalities. Mild
degenerative change of the bilateral sternal mandibular joints. Post
left hemi humeral arthroplasty, incompletely evaluated. Advanced
degenerative change of the right glenohumeral joint with associated
suspected subchondral cysts, incompletely evaluated.

Regional soft tissues appear normal. Normal appearance of the
thyroid gland.
IMPRESSION: 1. No acute cardiopulmonary disease.
2. A small amount of fluid is seen tracking within the bilateral
major and right minor fissures which may correlate with the findings
seen on preceding chest radiograph.
3. Sequela of prior granulomatous infection as above.
4. Solitary punctate (approximately 3 mm) noncalcified right upper
lobe pulmonary nodule. No follow-up needed if patient is low-risk.
Non-contrast chest CT can be considered in 12 months if patient is
high-risk. This recommendation follows the consensus statement:
Guidelines for Management of Incidental Pulmonary Nodules Detected
on CT Images:From the [HOSPITAL] 2880; published online
before print (10.1148/[id number]).
5. Coronary artery calcifications. Aortic Atherosclerosis
(TBIQN-170.0)
6. Borderline cardiomegaly with suspected calcifications within the
aortic valve leaflets. Further evaluation with cardiac echo could be
performed as clinically indicated.

## 2021-02-28 DEATH — deceased
